# Patient Record
Sex: Female | Born: 1980 | Race: White | Hispanic: No | Marital: Single | Smoking: Current some day smoker
Health system: Southern US, Community
[De-identification: ages and names within clinical notes are randomized; demographics above are authoritative.]

## PROBLEM LIST (undated history)

## (undated) DIAGNOSIS — T4145XA Adverse effect of unspecified anesthetic, initial encounter: Secondary | ICD-10-CM

## (undated) DIAGNOSIS — F329 Major depressive disorder, single episode, unspecified: Secondary | ICD-10-CM

## (undated) DIAGNOSIS — G4730 Sleep apnea, unspecified: Secondary | ICD-10-CM

## (undated) DIAGNOSIS — K219 Gastro-esophageal reflux disease without esophagitis: Secondary | ICD-10-CM

## (undated) DIAGNOSIS — T8859XA Other complications of anesthesia, initial encounter: Secondary | ICD-10-CM

## (undated) DIAGNOSIS — F32A Depression, unspecified: Secondary | ICD-10-CM

## (undated) DIAGNOSIS — R05 Cough: Secondary | ICD-10-CM

## (undated) DIAGNOSIS — N62 Hypertrophy of breast: Secondary | ICD-10-CM

## (undated) DIAGNOSIS — F419 Anxiety disorder, unspecified: Secondary | ICD-10-CM

## (undated) DIAGNOSIS — R0981 Nasal congestion: Secondary | ICD-10-CM

## (undated) DIAGNOSIS — K5090 Crohn's disease, unspecified, without complications: Secondary | ICD-10-CM

## (undated) HISTORY — DX: Anxiety disorder, unspecified: F41.9

## (undated) HISTORY — DX: Depression, unspecified: F32.A

## (undated) HISTORY — PX: TONSILLECTOMY AND ADENOIDECTOMY: SHX28

## (undated) HISTORY — PX: COLONOSCOPY: SHX174

## (undated) HISTORY — DX: Major depressive disorder, single episode, unspecified: F32.9

## (undated) HISTORY — DX: Crohn's disease, unspecified, without complications: K50.90

---

## 2002-01-18 ENCOUNTER — Other Ambulatory Visit: Admission: RE | Admit: 2002-01-18 | Discharge: 2002-01-18 | Payer: Self-pay | Admitting: Obstetrics and Gynecology

## 2002-05-23 ENCOUNTER — Ambulatory Visit (HOSPITAL_COMMUNITY): Admission: RE | Admit: 2002-05-23 | Discharge: 2002-05-23 | Payer: Self-pay | Admitting: Pulmonary Disease

## 2002-06-03 ENCOUNTER — Ambulatory Visit (HOSPITAL_COMMUNITY): Admission: RE | Admit: 2002-06-03 | Discharge: 2002-06-03 | Payer: Self-pay | Admitting: Pulmonary Disease

## 2005-04-04 ENCOUNTER — Emergency Department (HOSPITAL_COMMUNITY): Admission: EM | Admit: 2005-04-04 | Discharge: 2005-04-04 | Payer: Self-pay | Admitting: *Deleted

## 2005-05-01 ENCOUNTER — Ambulatory Visit (HOSPITAL_COMMUNITY): Admission: RE | Admit: 2005-05-01 | Discharge: 2005-05-01 | Payer: Self-pay | Admitting: Pulmonary Disease

## 2005-05-08 ENCOUNTER — Ambulatory Visit (HOSPITAL_COMMUNITY): Admission: RE | Admit: 2005-05-08 | Discharge: 2005-05-08 | Payer: Self-pay | Admitting: Pulmonary Disease

## 2005-05-12 ENCOUNTER — Ambulatory Visit (HOSPITAL_COMMUNITY): Admission: RE | Admit: 2005-05-12 | Discharge: 2005-05-12 | Payer: Self-pay | Admitting: General Surgery

## 2005-05-12 ENCOUNTER — Encounter (INDEPENDENT_AMBULATORY_CARE_PROVIDER_SITE_OTHER): Payer: Self-pay | Admitting: General Surgery

## 2005-05-12 HISTORY — PX: LAPAROSCOPIC CHOLECYSTECTOMY: SUR755

## 2006-02-25 ENCOUNTER — Other Ambulatory Visit: Admission: RE | Admit: 2006-02-25 | Discharge: 2006-02-25 | Payer: Self-pay | Admitting: Obstetrics and Gynecology

## 2010-12-02 ENCOUNTER — Encounter: Payer: Self-pay | Admitting: Pulmonary Disease

## 2011-03-28 NOTE — Op Note (Signed)
Pamela [name], Pamela [name]          ACCOUNT NO.:  [id number]   MEDICAL RECORD NO.:  [id number]          PATIENT TYPE:  AMB   LOCATION:  DAY                           FACILITY:  [facility]   PHYSICIAN:  Dalia Heading, M.D.  DATE OF BIRTH:  01-Aug-1981   DATE OF PROCEDURE:  05/12/2005  DATE OF DISCHARGE:                                 OPERATIVE REPORT   AGE:  30 years old.   PREOPERATIVE DIAGNOSIS:  Chronic cholecystitis.   POSTOPERATIVE DIAGNOSIS:  Chronic cholecystitis.   PROCEDURE:  Laparoscopic cholecystectomy.   SURGEON:  Dalia Heading, M.D.   ANESTHESIA:  General endotracheal.   INDICATIONS:  The patient is a 30 year old white female who presents with  chronic cholecystitis. The risks and benefits of the procedure including  bleeding, infection, hepatobiliary injury, and possibility of an open  procedure were fully explained to the patient, gave informed consent.   PROCEDURE NOTE:  The patient is placed in the supine position. After  induction of general endotracheal anesthesia, the abdomen was prepped and  draped using the usual sterile technique with Betadine. Surgical site  confirmation was performed.   A supraumbilical incision was made down to the fascia. Veress needle was  introduced into the abdominal cavity and confirmation of placement was done  using the saline drop test. The abdomen was then insufflated to 16 mmHg  pressure. A 11-mm trocar was introduced into the abdominal cavity under  direct visualization without difficulty. The patient was placed in reversed  Trendelenburg position. An additional 11-mm trocar was placed epigastric  region and  5-mm trocars placed in the right upper quadrant and right flank  regions. Liver was inspected, noted to be within normal limits. The  gallbladder was retracted superior and laterally. The dissection was begun  around the infundibulum of the gallbladder. The cystic duct was first  identified. Its juncture to the  infundibulum fully identified. Endoclips  were placed proximally and distally on the cystic duct. The cystic duct was  divided. This was likewise done with the cystic artery. The gallbladder was  then freed away from the gallbladder fossa using Bovie electrocautery. The  gallbladder was delivered through the epigastric trocar site using EndoCatch  bag. The gallbladder fossa was inspected and no abnormal bleeding or bile  leakage was noted. Surgicel was placed in the gallbladder fossa. All fluid  then evacuate from the abdominal cavity prior to removal the trocars.   All wounds were irrigated with normal saline. All wounds were injected with  0.5% Sensorcaine. The supraumbilical fascia was reapproximated using an 0  Vicryl interrupted suture. All skin incisions were closed using staples.  Betadine ointment and dry sterile dressings were applied.   All tape and needle counts were correct at the end of the procedure. The  patient was extubated in the operating room and went back to recovery room  awake in stable condition.   COMPLICATIONS:  None.   SPECIMEN:  Gallbladder.   BLOOD LOSS:  Minimal.       MAJ/MEDQ  D:  05/12/2005  T:  05/12/2005  Job:  [id number]   cc:   Ramon Dredge  Emilie Rutter, M.D.  [place]  [place] [address]  Fax: [phone]

## 2014-02-03 ENCOUNTER — Other Ambulatory Visit: Payer: Self-pay | Admitting: Gastroenterology

## 2014-02-03 DIAGNOSIS — R109 Unspecified abdominal pain: Secondary | ICD-10-CM

## 2014-02-03 DIAGNOSIS — R197 Diarrhea, unspecified: Secondary | ICD-10-CM

## 2014-02-03 DIAGNOSIS — K5000 Crohn's disease of small intestine without complications: Secondary | ICD-10-CM

## 2014-02-17 ENCOUNTER — Other Ambulatory Visit: Payer: Self-pay | Admitting: Gastroenterology

## 2014-02-17 DIAGNOSIS — K5000 Crohn's disease of small intestine without complications: Secondary | ICD-10-CM

## 2014-02-24 ENCOUNTER — Ambulatory Visit
Admission: RE | Admit: 2014-02-24 | Discharge: 2014-02-24 | Disposition: A | Discharge status: Home / Self Care | Payer: BC Managed Care – PPO | Source: Ambulatory Visit | Attending: Gastroenterology | Admitting: Gastroenterology

## 2014-02-24 DIAGNOSIS — K5000 Crohn's disease of small intestine without complications: Secondary | ICD-10-CM

## 2014-02-24 MED ORDER — IOHEXOL 300 MG/ML  SOLN
125.0000 mL | Freq: Once | INTRAMUSCULAR | Status: AC | PRN
  Administered 2014-02-24: 125 mL via INTRAVENOUS

## 2014-03-14 ENCOUNTER — Encounter (HOSPITAL_COMMUNITY): Payer: BC Managed Care – PPO | Admitting: Psychiatry

## 2014-03-14 ENCOUNTER — Encounter (INDEPENDENT_AMBULATORY_CARE_PROVIDER_SITE_OTHER): Payer: Self-pay

## 2014-03-14 NOTE — Progress Notes (Signed)
This encounter was created in error - please disregard.

## 2014-03-15 ENCOUNTER — Telehealth (HOSPITAL_COMMUNITY): Payer: Self-pay

## 2014-03-15 NOTE — Telephone Encounter (Signed)
03/15/14 9:40am Patient came on 03/14/14 for an initial visit with Dr. Michae KavaAgarwal but patient stated that she already had a psychiatrist  And requested a refund on her co-pay - patient called this morning stating that an error was made that co-pay was charged twice - processed a manual refund for $70.00 - will mail the pt her copy of the receipt./sh

## 2014-05-06 ENCOUNTER — Telehealth: Payer: Self-pay | Admitting: Internal Medicine

## 2014-05-06 NOTE — Telephone Encounter (Signed)
Mann pt,  Small bowel Crohn's disease - treated with AZA. Diagnosed 2009 Colon Jan 2015 - polyps, diverticulosis, and Crohn's disease unclear large or small bowel CT-enterography April 2015 no active Crohn's Ate popcorn and then blood in stool last night, today, diarrhea with blood. Blood is bright red, in water and on TP.  Only with BM.  No clots Lower abd pain just before BM, then relieved.  No other abdominal pain in between cramping associated with bowel movement No fevers or chills No change in appetite  This does not sound like an active Crohn's flare. Possible outlet bleeding I will notify Dr. Loreta Ave and I advised the patient that should she develop fever chills, abdominal pain, ongoing bleeding, or any other concerning symptom to go to the ER. She voiced understanding Patient report she may try a single dose of cholestyramine previously prescribed by Dr. Loreta Ave to help control diarrhea

## 2014-05-10 ENCOUNTER — Ambulatory Visit (INDEPENDENT_AMBULATORY_CARE_PROVIDER_SITE_OTHER): Payer: BC Managed Care – PPO | Admitting: General Surgery

## 2014-05-10 ENCOUNTER — Encounter (INDEPENDENT_AMBULATORY_CARE_PROVIDER_SITE_OTHER): Payer: Self-pay | Admitting: General Surgery

## 2014-05-10 DIAGNOSIS — K625 Hemorrhage of anus and rectum: Secondary | ICD-10-CM | POA: Insufficient documentation

## 2014-05-10 NOTE — Patient Instructions (Signed)
Can follow up with Dr. Loreta Ave

## 2014-05-22 NOTE — Progress Notes (Signed)
Patient ID: Pamela [name], female   DOB: 02/06/1981, 33 y.o.   MRN: [id number]  Chief Complaint  Patient presents with  . Hemorrhoids    HPI Pamela Manilalizabeth R Tocci is a 33 y.o. female.  We're asked to see the patient in consultation by Dr. Loreta AveMann to evaluate her for rectal bleeding. The patient is a 33 year old white female who has a history of Crohn's disease. On Friday she ate some popcorn. On Saturday she developed significant bleeding with her bowel movements. She normally has liquid bowel movements. She went to see her gastroenterologist who referred her here for further evaluation. She denies any rectal pain. She has had no bleeding over the last 24 hours.  HPI  Past Medical History  Diagnosis Date  . Crohn's disease   . Hemorrhoids, internal   . History of anal fissures     History reviewed. No pertinent past surgical history.  No family history on file.  Social History History  Substance Use Topics  . Smoking status: Not on file  . Smokeless tobacco: Not on file  . Alcohol Use: Not on file    Not on File  Current Outpatient Prescriptions  Medication Sig Dispense Refill  . azaTHIOprine (IMURAN) 50 MG tablet Take 50 mg by mouth daily.      . BuPROPion HCl (WELLBUTRIN PO) Take 350 mg by mouth.      . busPIRone (BUSPAR) 15 MG tablet Take 15 mg by mouth 2 (two) times daily.      Marland Kitchen. FLUoxetine (PROZAC) 40 MG capsule Take 40 mg by mouth daily.       No current facility-administered medications for this visit.    Review of Systems Review of Systems  Constitutional: Negative.   HENT: Negative.   Eyes: Negative.   Respiratory: Negative.   Cardiovascular: Negative.   Gastrointestinal: Positive for diarrhea and anal bleeding.  Endocrine: Negative.   Genitourinary: Negative.   Musculoskeletal: Negative.   Skin: Negative.   Allergic/Immunologic: Negative.   Neurological: Negative.   Hematological: Negative.   Psychiatric/Behavioral: Negative.     There were no  vitals taken for this visit.  Physical Exam Physical Exam  Constitutional: She is oriented to person, place, and time. She appears well-developed and well-nourished.  HENT:  Head: Normocephalic and atraumatic.  Eyes: Conjunctivae and EOM are normal. Pupils are equal, round, and reactive to light.  Neck: Normal range of motion. Neck supple.  Cardiovascular: Normal rate, regular rhythm and normal heart sounds.   Pulmonary/Chest: Effort normal and breath sounds normal.  Abdominal: Soft. Bowel sounds are normal.  Genitourinary:  On exam her perirectal skin looks normal. On digital exam she has no palpable mass and normal rectal tone. On anoscopic exam I see no evidence of significant irritated internal hemorrhoidal disease and there is no blood in the rectal vault.  Musculoskeletal: Normal range of motion.  Lymphadenopathy:    She has no cervical adenopathy.  Neurological: She is alert and oriented to person, place, and time.  Skin: Skin is warm and dry.  Psychiatric: She has a normal mood and affect. Her behavior is normal.    Data Reviewed As above  Assessment    The patient has a history of Crohn's disease and presented initially with some rectal bleeding. This has since resolved. I see no evidence of significant hemorrhoidal tissue that could be the source of the bleeding     Plan    At this point I would recommend that she return to her gastroenterologist  for further evaluation and possible colonoscopy or flexible sigmoidoscopy        TOTH III,Geneen Dieter S 05/22/2014, 10:54 AM

## 2014-08-08 ENCOUNTER — Encounter: Payer: Self-pay | Admitting: Neurology

## 2014-08-08 ENCOUNTER — Ambulatory Visit (INDEPENDENT_AMBULATORY_CARE_PROVIDER_SITE_OTHER): Payer: BC Managed Care – PPO | Admitting: Neurology

## 2014-08-08 VITALS — BP 128/89 | HR 113 | Temp 98.6°F | Ht 68.0 in | Wt 298.0 lb

## 2014-08-08 DIAGNOSIS — G479 Sleep disorder, unspecified: Secondary | ICD-10-CM

## 2014-08-08 DIAGNOSIS — G4700 Insomnia, unspecified: Secondary | ICD-10-CM

## 2014-08-08 DIAGNOSIS — R351 Nocturia: Secondary | ICD-10-CM

## 2014-08-08 DIAGNOSIS — G4733 Obstructive sleep apnea (adult) (pediatric): Secondary | ICD-10-CM

## 2014-08-08 DIAGNOSIS — G478 Other sleep disorders: Secondary | ICD-10-CM

## 2014-08-08 DIAGNOSIS — G2581 Restless legs syndrome: Secondary | ICD-10-CM

## 2014-08-08 DIAGNOSIS — G4730 Sleep apnea, unspecified: Secondary | ICD-10-CM

## 2014-08-08 NOTE — Patient Instructions (Signed)

## 2014-08-08 NOTE — Progress Notes (Signed)
Subjective:    Patient ID: Galen Manilalizabeth R Silliman is a 33 y.o. female.  HPI    Huston FoleySaima Rayce Brahmbhatt, MD, PhD [facility] [address], [address] [address] [place], [place] [id number]  Dear Jolee EwingJyothi,   I saw your patient, Broadus Johnlizabeth Aja, upon your kind request in my neurologic clinic today for initial consultation of her sleep disorder, in particular, concern for underlying obstructive sleep apnea. The patient is unaccompanied today. As you know, Ms. Orvan FalconerCampbell is a very friendly 33 year old right-handed woman with an underlying medical history of Crohn's disease, hemorrhoids, OCD, colonic polyps, diverticulosis, depression, anxiety, acid reflux disease, gluten enteropathy and obesity, who was noted to have witnessed apneas during a recent colonoscopy procedure. She has a longstanding history of insomnia since childhood and has been on numerous medications for this, including Ambien and Lunesta (did not help), Trazodone and Seroquel (helped, but was not good for her GI illness), Valium did not help and caused HAs. OTC sleep aids did not help, except when she took about 5 benadryl. Klonopin helps, but rarely takes it and is discouraged from taking it daily. Most recently, she has been placed on Zyrexa in 2012. She has gained a lot of weight on it. She follows with Oneta Rackobyn Bridges, [facility].  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 She has noted RLS symptoms, especially when the Zyprexa kicks in. She has no FHx of OSA or insomnia or RLS.    Her typical bedtime is reported to be around 10 PM and usual wake time is around 7:45 to as late as 11 AM. Sleep onset typically occurs within 15 minutes. She reports feeling marginally rested upon awakening. She has daytime drowsy first thing in the mornings. She feels tired, but not sleepy. She wakes up on an average 4 times in the middle of the night and has to go to the bathroom 2 times on a typical night. She denies  morning headaches.  She reports excessive daytime somnolence (EDS) and Her Epworth Sleepiness Score (ESS) is 3/24 today. She has not fallen asleep while driving. The patient has not been taking a planned nap.  She has been known to snore for the past few years. Snoring is reportedly moderate, and associated with choking sounds and witnessed apneas. The patient denies a sense of choking or strangling feeling. There is report of nighttime reflux, with no nighttime cough experienced. She denies cataplexy, sleep paralysis, hypnagogic or hypnopompic hallucinations, or sleep attacks. She does report vivid dreams, no nightmares, dream enactments, or parasomnias, such as sleep talking or sleep walking. The patient has not had a sleep study or a home sleep test.  She consumes 0 caffeinated beverages per day, stopped smoking in June 2015 and and rarely drinks alcohol. There is a TV in her bedroom and she watches it at night turns out for falling asleep.  Her Past Medical History Is Significant For: Past Medical History  Diagnosis Date  . Crohn's disease   . Hemorrhoids, internal   . History of anal fissures   . Depression   . Anxiety     Her Past Surgical History Is Significant For: Past Surgical History  Procedure Laterality Date  . Gallbladder surgery  2006    Her Family History Is Significant For: Family History  Problem Relation Age of Onset  . Crohn's disease Maternal Grandmother   . Cancer Paternal Grandfather     stomach    Her Social History Is Significant  For: History   Social History  . Marital Status: Single    Spouse Name: N/A    Number of Children: 0  . Years of Education: BS   Occupational History  .      [employer]   Social History Main Topics  . Smoking status: Former Games developer  . Smokeless tobacco: Never Used     Comment: quit 04/2014  . Alcohol Use: Yes     Comment: one time monthly  . Drug Use: No  . Sexual Activity: None   Other Topics Concern  . None   Social  History Narrative   Patient is right handed and resides with roommate, consumes no caffeine.    Her Allergies Are:  No Known Allergies:   Her Current Medications Are:  Outpatient Encounter Prescriptions as of 08/08/2014  Medication Sig  . azaTHIOprine (IMURAN) 50 MG tablet Take 50 mg by mouth daily.  . BuPROPion HCl (WELLBUTRIN PO) Take 350 mg by mouth.  . busPIRone (BUSPAR) 15 MG tablet Take 15 mg by mouth 2 (two) times daily.  Marland Kitchen FLUoxetine (PROZAC) 40 MG capsule Take 40 mg by mouth daily.  Marland Kitchen lurasidone (LATUDA) 40 MG TABS tablet Take 40 mg by mouth daily with breakfast.  . OLANZapine (ZYPREXA) 15 MG tablet Take 15 mg by mouth at bedtime.  :  Review of Systems:  Out of a complete 14 point review of systems, all are reviewed and negative with the exception of these symptoms as listed below:  Review of Systems  Constitutional:       Weight gain,fatigue  Gastrointestinal:       Diarrhea  Neurological:       Insomnia,snoring, restless legs  Psychiatric/Behavioral:       Anxiety, not enough sleep, decreased energy, change in appetite    Objective:  Neurologic Exam  Physical Exam Physical Examination:   Filed Vitals:   08/08/14 0938  BP: 128/89  Pulse: 113  Temp: 98.6 F (37 C)    General Examination: The patient is a very pleasant 33 y.o. female in no acute distress. She appears well-developed and well-nourished and well groomed. She is obese.   HEENT: Normocephalic, atraumatic, pupils are equal, round and reactive to light and accommodation. Funduscopic exam is normal with sharp disc margins noted. Extraocular tracking is good without limitation to gaze excursion or nystagmus noted. Normal smooth pursuit is noted. Hearing is grossly intact. Tympanic membranes are clear bilaterally. Face is symmetric with normal facial animation and normal facial sensation. Speech is clear with no dysarthria noted. There is no hypophonia. There is no lip, neck/head, jaw or voice tremor.  Neck is supple with full range of passive and active motion. There are no carotid bruits on auscultation. Oropharynx exam reveals: mouth dryness, good dental hygiene and severe airway crowding, due to thicker soft palate and swollen uvula, larger tonsils and enlarged tongue. Mallampati is class II. Tongue protrudes centrally and palate elevates symmetrically. Tonsils are 2-3+ in size. Neck size is 17.5 inches. She has a nearly Absent overbite. Nasal inspection reveals no significant nasal mucosal bogginess or redness and no septal deviation.   Chest: Clear to auscultation without wheezing, rhonchi or crackles noted.  Heart: S1+S2+0, regular and normal without murmurs, rubs or gallops noted.   Abdomen: Soft, non-tender and non-distended with normal bowel sounds appreciated on auscultation.  Extremities: There is no pitting edema in the distal lower extremities bilaterally. Pedal pulses are intact.  Skin: Warm and dry without trophic changes noted. There are  no varicose veins.  Musculoskeletal: exam reveals no obvious joint deformities, tenderness or joint swelling or erythema.   Neurologically:  Mental status: The patient is awake, alert and oriented in all 4 spheres. Her immediate and remote memory, attention, language skills and fund of knowledge are appropriate. There is no evidence of aphasia, agnosia, apraxia or anomia. Speech is clear with normal prosody and enunciation. Thought process is linear. Mood is normal and affect is normal.  Cranial nerves II - XII are as described above under HEENT exam. In addition: shoulder shrug is normal with equal shoulder height noted. Motor exam: Normal bulk, strength and tone is noted. There is no drift, tremor or rebound. Romberg is negative. Reflexes are 2+ throughout. Babinski: Toes are flexor bilaterally. Fine motor skills and coordination: intact with normal finger taps, normal hand movements, normal rapid alternating patting, normal foot taps and normal  foot agility.  Cerebellar testing: No dysmetria or intention tremor on finger to nose testing. Heel to shin is unremarkable bilaterally. There is no truncal or gait ataxia.  Sensory exam: intact to light touch, pinprick, vibration, temperature sense in the upper and lower extremities.  Gait, station and balance: She stands easily. No veering to one side is noted. No leaning to one side is noted. Posture is age-appropriate and stance is narrow based. Gait shows normal stride length and normal pace. No problems turning are noted. She turns en bloc. Tandem walk is unremarkable. Intact toe and heel stance is noted.               Assessment and Plan:   In summary, DARONDA GAUR is a very pleasant 33 y.o.-year old female  with an underlying medical history of Crohn's disease, hemorrhoids, OCD, colonic polyps, diverticulosis, depression, anxiety, acid reflux disease, gluten enteropathy and obesity, with a history and physical exam highly concerning for obstructive sleep apnea (OSA). She reports sleep disruption, nocturia, nonrestorative sleep, witnessed apneas, and she also reports some restless leg symptoms. I had a long chat with the patient about my findings and the diagnosis of OSA, its prognosis and treatment options. We talked about medical treatments, surgical interventions and non-pharmacological approaches. I explained in particular the risks and ramifications of untreated moderate to severe OSA, especially with respect to developing cardiovascular disease down the Road, including congestive heart failure, difficult to treat hypertension, cardiac arrhythmias, or stroke. Even type 2 diabetes has, in part, been linked to untreated OSA. Symptoms of untreated OSA include daytime sleepiness, memory problems, mood irritability and mood disorder such as depression and anxiety, lack of energy, as well as recurrent headaches, especially morning headaches. We talked about trying to maintain a healthy  lifestyle in general, as well as the importance of weight control. I encouraged the patient to eat healthy, exercise daily and keep well hydrated, to keep a scheduled bedtime and wake time routine, to not skip any meals and eat healthy snacks in between meals. I advised the patient not to drive when feeling sleepy. I recommended the following at this time: sleep study with potential positive airway pressure titration. (We will score hypopneas at 3*% and split the sleep study into diagnostic and treatment portion, if the estimated. 2 hour AHI is >15/h).   I explained the sleep test procedure to the patient and also outlined possible surgical and non-surgical treatment options of OSA, including the use of a custom-made dental device (which would require a referral to a specialist dentist or oral surgeon), upper airway surgical options, such as  pillar implants, radiofrequency surgery, tongue base surgery, and UPPP (which would involve a referral to an ENT surgeon). Rarely, jaw surgery such as mandibular advancement may be considered.  I also explained the CPAP treatment option to the patient, who indicated that she would be willing to try CPAP if the need arises. I explained the importance of being compliant with PAP treatment, not only for insurance purposes but primarily to improve Her symptoms, and for the patient's long term health benefit, including to reduce Her cardiovascular risks. I answered all her questions today and the patient was in agreement. I would like to see her back after the sleep study is completed and encouraged her to call with any interim questions, concerns, problems or updates.   Thank you very much for allowing me to participate in the care of this nice patient. If I can be of any further assistance to you please do not hesitate to call me at [phone].  Sincerely,   Huston Foley, MD, PhD

## 2014-08-09 ENCOUNTER — Ambulatory Visit (INDEPENDENT_AMBULATORY_CARE_PROVIDER_SITE_OTHER): Payer: BC Managed Care – PPO | Admitting: Neurology

## 2014-08-09 DIAGNOSIS — G478 Other sleep disorders: Secondary | ICD-10-CM

## 2014-08-09 DIAGNOSIS — R351 Nocturia: Secondary | ICD-10-CM

## 2014-08-09 DIAGNOSIS — G4733 Obstructive sleep apnea (adult) (pediatric): Secondary | ICD-10-CM

## 2014-08-09 DIAGNOSIS — G2581 Restless legs syndrome: Secondary | ICD-10-CM

## 2014-08-11 ENCOUNTER — Telehealth: Payer: Self-pay | Admitting: Neurology

## 2014-08-11 DIAGNOSIS — G4733 Obstructive sleep apnea (adult) (pediatric): Secondary | ICD-10-CM

## 2014-08-11 NOTE — Telephone Encounter (Signed)
Please call and notify patient that the recent sleep study confirmed the diagnosis of OSA. She did well with CPAP during the study with significant improvement of the respiratory events. Therefore, I would like start the patient on CPAP at home. I placed the order in the chart.   Arrange for CPAP set up at home through a DME company of patient's choice and fax/route report to PCP and referring MD (if other than PCP).   The patient will also need a follow up appointment with me in 6-8 weeks post set up that has to be scheduled; help the patient schedule this (in a follow-up slot).   Please re-enforce the importance of compliance with treatment and the need for us to monitor compliance data.   Once you have spoken to the patient and scheduled the return appointment, you may close this encounter, thanks,   Janeene Sand, MD, PhD [facility] ([facility]    

## 2014-08-15 ENCOUNTER — Encounter: Payer: Self-pay | Admitting: Neurology

## 2014-08-16 NOTE — Telephone Encounter (Signed)
Patient was contacted and provided the results of her split night study.  Patient was informed that given the severity of her sleep apnea the doctor had recommended CPAP therapy.  Patient was referred to [facility] for CPAP set up.  Patient informed that a copy would be mailed to her and I faxed a copy to Dr. Charna Najai.  Patient instructed to contact office to schedule a follow up appointment 6-8 weeks after beginning therapy.

## 2014-09-21 ENCOUNTER — Encounter: Payer: Self-pay | Admitting: Neurology

## 2014-11-22 ENCOUNTER — Encounter: Payer: Self-pay | Admitting: Neurology

## 2014-12-07 ENCOUNTER — Encounter: Payer: Self-pay | Admitting: Neurology

## 2014-12-11 ENCOUNTER — Encounter: Payer: Self-pay | Admitting: Neurology

## 2014-12-11 ENCOUNTER — Ambulatory Visit (INDEPENDENT_AMBULATORY_CARE_PROVIDER_SITE_OTHER): Payer: BC Managed Care – PPO | Admitting: Neurology

## 2014-12-11 VITALS — BP 111/68 | HR 91 | Temp 98.4°F | Ht 68.0 in | Wt 273.0 lb

## 2014-12-11 DIAGNOSIS — G4733 Obstructive sleep apnea (adult) (pediatric): Secondary | ICD-10-CM

## 2014-12-11 DIAGNOSIS — Z9989 Dependence on other enabling machines and devices: Principal | ICD-10-CM

## 2014-12-11 NOTE — Progress Notes (Signed)
Subjective:    Pamela [name] ID: Pamela Pamela [name] is a 34 y.o. female.  HPI     Interim history:   Pamela Pamela [name] is a 34 year old right-handed woman with an underlying medical history of Crohn's disease, hemorrhoids, OCD, colonic polyps, diverticulosis, depression, anxiety, acid reflux disease, gluten enteropathy and obesity, who presents for follow-up consultation of Pamela Pamela [name] obstructive sleep apnea, now on CPAP therapy. Pamela Pamela [name] is unaccompanied today. I first met Pamela Pamela [name] on 08/08/2014 at which time she was referred by Pamela Pamela [name] GI physician, due to witnessed apneas noted during a recent colonoscopy procedure. I requested that she return for sleep study. She had a split-night sleep study on 08/09/2014 and underwent over Pamela Pamela [name] sleep test results with Pamela Pamela [name] in detail today. Baseline sleep efficiency was 73.2% with a latency to sleep of 23 minutes and wake after sleep onset of 18.5 minutes with mild sleep fragmentation noted. She had a markedly elevated arousal index due to respiratory events. She had absence of REM sleep during Pamela baseline part of Pamela study. She had moderate to loud snoring. Total AHI was 167 per hour. Baseline oxygen saturation was 92%, nadir was 84%. She was titrated on CPAP. Sleep efficiency was 79.6%. She had an increased percentage of slow-wave sleep and a markedly increased percentage of REM sleep. Average oxygen saturation was 94%, nadir was 89%. No significant PLMS were noted during either part of Pamela study. She was titrated from 5-12 cm with an AHI of 6.8 per hour at Pamela final pressure. I reviewed Pamela Pamela [name] compliance data from 08/22/2014 through 09/20/2014 which is a total of 30 days during which time she used Pamela Pamela [name] machine 28 days with percent used days greater than 4 hours at 77%, indicating adequate compliance with an average usage of 7 hours and 46 minutes, pressure at 12 cm with EPR of 3, residual AHI low at 1.5 per hour, and leak low at 9.6 L/m at Pamela 95th percentile. I reviewed Pamela Pamela [name] compliance  data from 10/23/2014 through 11/21/2014 which is a total of 30 days during which time she used Pamela Pamela [name] machine 29 days with percent used days greater than 4 hours at 87%, indicating very good compliance, average usage of 7 hours and 23 minutes, residual AHI at 0.6 per hour and leak low.  Today, I reviewed Pamela Pamela [name] compliance data from 11/07/2014 through 12/06/2014 which is a total of 30 days during which time she used Pamela Pamela [name] machine 26 days with percent used days greater than 4 hours at 77%, average usage of 6 hours and 30 minutes, pressure at 12 cm with EPR of 3, residual AHI of 0.8 per hour and leak low at 5.6 L/m at Pamela 95th percentile.  Today, she reports doing better with Pamela Pamela [name] sleep. She is less restless and sleeps better, feels better rested. She has had more stress at work, however, and will request a sooner appointment with Pamela Pamela [name] psychiatrist.  She has been taking an OTC sleep aid. She is off of Zyprexa, and has been able to lose weight. She is no longer on Imuran as it was not helpful.  She has a longstanding history of insomnia since childhood and has been on numerous medications for this, including Ambien and Lunesta (did not help), Trazodone and Seroquel (helped, but was not good for Pamela Pamela [name] GI illness), Valium did not help and caused HAs. OTC sleep aids did not help, except when she took about 5 benadryl. Klonopin helps, but rarely takes it and is discouraged from taking it daily. Most recently, she has been placed  on Zyprexa in 2012. She has gained a lot of weight on it. She follows with [name], NP at [facility].   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 She has noted RLS symptoms, especially when Pamela Zyprexa kicks in. She has no FHx of OSA or insomnia or RLS.    Pamela Pamela [name] typical bedtime is reported to be around 10 PM and usual wake time is around 7:45 to as late as 11 AM. Sleep onset typically occurs within 15 minutes. She reports feeling marginally rested upon awakening. She has  daytime drowsy first thing in Pamela mornings. She feels tired, but not sleepy. She wakes up on an average 4 times in Pamela middle of Pamela night and has to go to Pamela bathroom 2 times on a typical night. She denies morning headaches.   She reports excessive daytime somnolence (EDS) and Pamela Pamela [name] Epworth Sleepiness Score (ESS) is 3/24 today. She has not fallen asleep while driving. Pamela Pamela [name] has not been taking a planned nap.   She has been known to snore for Pamela past few years. Snoring is reportedly moderate, and associated with choking sounds and witnessed apneas. Pamela Pamela [name] denies a sense of choking or strangling feeling. There is report of nighttime reflux, with no nighttime cough experienced. She denies cataplexy, sleep paralysis, hypnagogic or hypnopompic hallucinations, or sleep attacks. She does report vivid dreams, no nightmares, dream enactments, or parasomnias, such as sleep talking or sleep walking. Pamela Pamela [name] has not had a sleep study or a home sleep test.   She consumes 0 caffeinated beverages per day, stopped smoking in June 2015 and and rarely drinks alcohol. There is a TV in Pamela Pamela [name] bedroom and she watches it at night turns out for falling asleep.   Pamela Pamela [name] Past Medical History Is Significant For: Past Medical History  Diagnosis Date  . Crohn's disease   . Hemorrhoids, internal   . History of anal fissures   . Depression   . Anxiety     Pamela Pamela [name] Past Surgical History Is Significant For: Past Surgical History  Procedure Laterality Date  . Gallbladder surgery  2006    Pamela Pamela [name] Family History Is Significant For: Family History  Problem Relation Age of Onset  . Crohn's disease Maternal Grandmother   . Cancer Paternal Grandfather     stomach    Pamela Pamela [name] Social History Is Significant For: History   Social History  . Marital Status: Single    Spouse Name: N/A    Number of Children: 0  . Years of Education: BS   Occupational History  .      [employer]   Social History Main Topics  . Smoking status:  Current Some Day Smoker  . Smokeless tobacco: Never Used     Comment: quit 04/2014,started back smoking 11/2014  . Alcohol Use: 0.0 oz/week    0 Not specified per week     Comment: one time monthly  . Drug Use: No  . Sexual Activity: None   Other Topics Concern  . None   Social History Narrative   Pamela [name] is right handed and resides with roommate, consumes no caffeine.    Pamela Pamela [name] Allergies Are:  No Known Allergies:   Pamela Pamela [name] Current Medications Are:  Outpatient Encounter Prescriptions as of 12/11/2014  Medication Sig  . buPROPion (WELLBUTRIN XL) 300 MG 24 hr tablet Take by mouth daily.   . busPIRone (BUSPAR) 15 MG tablet Take 15 mg by mouth 3 (three) times daily.   Marland Kitchen FLUoxetine (PROZAC)  40 MG capsule Take 40 mg by mouth daily.  Marland Kitchen lurasidone (LATUDA) 40 MG TABS tablet Take 40 mg by mouth daily with breakfast.  . Lurasidone HCl 20 MG TABS Take by mouth daily. Daily with breakfast  . [DISCONTINUED] azaTHIOprine (IMURAN) 50 MG tablet Take 50 mg by mouth daily.  . [DISCONTINUED] BuPROPion HCl (WELLBUTRIN PO) Take 350 mg by mouth.  . [DISCONTINUED] OLANZapine (ZYPREXA) 15 MG tablet Take 15 mg by mouth at bedtime.  :  Review of Systems:  Out of a complete 14 point review of systems, all are reviewed and negative with Pamela exception of these symptoms as listed below:   Review of Systems  Psychiatric/Behavioral:       Anxious    Objective:  Neurologic Exam  Physical Exam Physical Examination:   Filed Vitals:   12/11/14 0845  BP: 111/68  Pulse: 91  Temp: 98.4 F (36.9 C)    General Examination: Pamela Pamela [name] is a very pleasant 34 y.o. female in no acute distress. She appears well-developed and well-nourished and well groomed. She is obese.   HEENT: Normocephalic, atraumatic, pupils are equal, round and reactive to light and accommodation. Funduscopic exam is normal with sharp disc margins noted. Extraocular tracking is good without limitation to gaze excursion or nystagmus noted.  Normal smooth pursuit is noted. Hearing is grossly intact. Tympanic membranes are clear bilaterally. Face is symmetric with normal facial animation and normal facial sensation. Speech is clear with no dysarthria noted. There is no hypophonia. There is no lip, neck/head, jaw or voice tremor. Neck is supple with full range of passive and active motion. There are no carotid bruits on auscultation. Oropharynx exam reveals: mouth dryness, good dental hygiene and severe airway crowding, due to thicker soft palate and swollen uvula, larger tonsils and enlarged tongue. Mallampati is class II. Tongue protrudes centrally and palate elevates symmetrically. Tonsils are 2-3+ in size. Throat is slightly erythematous.  Chest: Clear to auscultation without wheezing, rhonchi or crackles noted.  Heart: S1+S2+0, regular and normal without murmurs, rubs or gallops noted.   Abdomen: Soft, non-tender and non-distended with normal bowel sounds appreciated on auscultation.  Extremities: There is no pitting edema in Pamela distal lower extremities bilaterally. Pedal pulses are intact.  Skin: Warm and dry without trophic changes noted. There are no varicose veins.  Musculoskeletal: exam reveals no obvious joint deformities, tenderness or joint swelling or erythema.   Neurologically:  Mental status: Pamela Pamela [name] is awake, alert and oriented in all 4 spheres. Pamela Pamela [name] immediate and remote memory, attention, language skills and fund of knowledge are appropriate. There is no evidence of aphasia, agnosia, apraxia or anomia. Speech is clear with normal prosody and enunciation. Thought process is linear. Mood is normal and affect is normal.  Cranial nerves II - XII are as described above under HEENT exam. In addition: shoulder shrug is normal with equal shoulder height noted. Motor exam: Normal bulk, strength and tone is noted. There is no drift, tremor or rebound. Romberg is negative. Reflexes are 2+ throughout. Babinski: Toes are flexor  bilaterally. Fine motor skills and coordination: intact with normal finger taps, normal hand movements, normal rapid alternating patting, normal foot taps and normal foot agility.  Cerebellar testing: No dysmetria or intention tremor on finger to nose testing. Heel to shin is unremarkable bilaterally. There is no truncal or gait ataxia.  Sensory exam: intact to light touch, pinprick, vibration, temperature sense in Pamela upper and lower extremities.  Gait, station and balance: She stands easily.  No veering to one side is noted. No leaning to one side is noted. Posture is age-appropriate and stance is narrow based. Gait shows normal stride length and normal pace. No problems turning are noted. She turns en bloc. Tandem walk is unremarkable.              Assessment and Plan:   In summary, RAINBOW SALMAN is a very pleasant 34 year old female with an underlying medical history of Crohn's disease, hemorrhoids, OCD, colonic polyps, diverticulosis, depression, anxiety, acid reflux disease, gluten enteropathy and obesity, who presents for follow-up consultation of Pamela Pamela [name] severe obstructive sleep apnea, treated with CPAP at a pressure of 12 cm with good compliance. She is advised not to skip any days. We talked at length about Pamela Pamela [name] sleep test results from September 2015 and Pamela Pamela [name] compliance data. I explained all Pamela Pamela [name] numbers to Pamela Pamela [name] in detail. She understands Pamela diagnosis of severe obstructive sleep apnea and its implications long-term as far as cardiovascular complications. She has benefited from treatment. Thankfully she feels better in Pamela Pamela [name] sleep and during Pamela day. She does take an over-Pamela-counter sleep aid. She is no longer on Zyprexa or Imuran at this time. Pamela Pamela [name] sleep quality has improved. She is pleased with Pamela results.  I had a long chat with Pamela Pamela [name] about my findings and Pamela diagnosis of OSA, its prognosis and treatment options. We talked about medical treatments, surgical interventions and  non-pharmacological approaches. I explained in particular Pamela risks and ramifications of untreated moderate to severe OSA, especially with respect to developing cardiovascular disease down Pamela Road, including congestive heart failure, difficult to treat hypertension, cardiac arrhythmias, or stroke. Even type 2 diabetes has, in part, been linked to untreated OSA. Symptoms of untreated OSA include daytime sleepiness, memory problems, mood irritability and mood disorder such as depression and anxiety, lack of energy, as well as recurrent headaches, especially morning headaches. We talked about trying to maintain a healthy lifestyle in general, as well as Pamela importance of weight control. I encouraged Pamela Pamela [name] to eat healthy, exercise daily and keep well hydrated, to keep a scheduled bedtime and wake time routine, to not skip any meals and eat healthy snacks in between meals. I advised Pamela Pamela [name] not to drive when feeling sleepy. I recommended Pamela following at this time: continue using CPAP regularly and to not skip any nights.  I explained Pamela importance of being compliant with PAP treatment, not only for insurance purposes but primarily to improve Pamela Pamela [name] symptoms, and for Pamela Pamela [name]'s long term health benefit, including to reduce Pamela Pamela [name] cardiovascular risks. I answered all Pamela Pamela [name] questions today and Pamela Pamela [name] was in agreement. I would like to see Pamela Pamela [name] back in 6 months, sooner if Pamela need arises. I answered all Pamela Pamela [name] questions today and she was in agreement.

## 2014-12-11 NOTE — Patient Instructions (Signed)

## 2015-06-11 ENCOUNTER — Encounter: Payer: Self-pay | Admitting: Neurology

## 2015-06-11 ENCOUNTER — Ambulatory Visit (INDEPENDENT_AMBULATORY_CARE_PROVIDER_SITE_OTHER): Payer: BC Managed Care – PPO | Admitting: Neurology

## 2015-06-11 VITALS — BP 116/88 | HR 90 | Resp 16 | Ht 68.0 in | Wt 204.0 lb

## 2015-06-11 DIAGNOSIS — G4733 Obstructive sleep apnea (adult) (pediatric): Secondary | ICD-10-CM

## 2015-06-11 DIAGNOSIS — R634 Abnormal weight loss: Secondary | ICD-10-CM

## 2015-06-11 DIAGNOSIS — R519 Headache, unspecified: Secondary | ICD-10-CM

## 2015-06-11 DIAGNOSIS — R51 Headache: Secondary | ICD-10-CM

## 2015-06-11 NOTE — Progress Notes (Signed)
Subjective:    [name] is a 34 y.o. female.  HPI     Interim history:   Pamela [name] is a 34 year old right-handed woman with an underlying medical history of Crohn's disease, hemorrhoids, OCD, colonic polyps, diverticulosis, depression, anxiety, acid reflux disease, gluten enteropathy and obesity, who presents for follow-up consultation of Pamela [name] obstructive sleep apnea, now on CPAP therapy. Pamela [name] is unaccompanied today. I last saw Pamela [name] on 12/11/2014, at which time she reported sleeping better with CPAP. She was less restless and felt better rested. She was able to lose weight. She was off of [name]. She was no longer on Imuran either. She reported stress. I encouraged Pamela [name] to continue to use CPAP regularly.  Today, 06/11/2015: I reviewed Pamela [name] CPAP compliance data from 02/22/2015 through 05/22/2015 which is a total of 90 days during which time she used Pamela [name] machine only 5 days with percent used days greater than 4 hours of only 3%, indicating noncompliance. Residual AHI 0.4 per hour, leaked low, [name] at 12 cm with EPR of 3, average usage of 14 minutes for all days and 4 hours and 13 minutes for days used.   Today, 06/11/2015: She reports that she lost a significant amount of weight since Nov 2015. Pamela [name] weight peaked at 310 [name] November 2015. Since then she stopped Pamela [name]. She felt mood wise that she did better on [name] but weight gain was severe. Since then she has been able to lose almost 100 pounds. She has stopped using his CPAP. She also felt uncomfortable with it. She did not like Pamela [name]. She feels that Pamela [name] was too high. She has had some recurrent headaches [name] Pamela [name]. These are described as bitemporal and bifrontal, non-throbbing, worse with head movements. Pamela [name] primary care physician provided a prescription for meclizine which actually helped. She has some associated nausea and some photophobia with it. It helps to lay still or not  move Pamela [name] head. She also takes over-Pamela-counter headache medication. She currently does not use caffeine on a day-to-day basis. She is on latuda 80 mg daily. She is on Valium as needed. She is also on Wellbutrin and BuSpar. She has been referred to an endocrinologist.  Previously:  I first met Pamela [name] on 08/08/2014 at which time she was referred by Pamela [name] GI physician, due to witnessed apneas noted during a recent colonoscopy procedure. I requested that she return for sleep study. She had a split-night sleep study on 08/09/2014 and underwent over Pamela [name] sleep test results with Pamela [name] [name] detail today. Baseline sleep efficiency was 73.2% with a latency to sleep of 23 minutes and wake after sleep onset of 18.5 minutes with mild sleep fragmentation noted. She had a markedly elevated arousal index due to respiratory events. She had absence of REM sleep during Pamela baseline part of Pamela study. She had moderate to loud snoring. Total AHI was 167 per hour. Baseline oxygen saturation was 92%, nadir was 84%. She was titrated on CPAP. Sleep efficiency was 79.6%. She had an increased percentage of slow-wave sleep and a markedly increased percentage of REM sleep. Average oxygen saturation was 94%, nadir was 89%. No significant PLMS were noted during either part of Pamela study. She was titrated from 5-12 cm with an AHI of 6.8 per hour at Pamela final [name]. I reviewed Pamela [name] compliance data from 08/22/2014 through 09/20/2014 which is a total of 30 days during which time she used Pamela [name] machine 28 days with percent used days greater  than 4 hours at 77%, indicating adequate compliance with an average usage of 7 hours and 46 minutes, [name] at 12 cm with EPR of 3, residual AHI low at 1.5 per hour, and leak low at 9.6 L/m at Pamela 95th percentile. I reviewed Pamela [name] compliance data from 10/23/2014 through 11/21/2014 which is a total of 30 days during which time she used Pamela [name] machine 29 days with percent used days greater than 4 hours at 87%, indicating  very good compliance, average usage of 7 hours and 23 minutes, residual AHI at 0.6 per hour and leak low.  I reviewed Pamela [name] compliance data from 11/07/2014 through 12/06/2014 which is a total of 30 days during which time she used Pamela [name] machine 26 days with percent used days greater than 4 hours at 77%, average usage of 6 hours and 30 minutes, [name] at 12 cm with EPR of 3, residual AHI of 0.8 per hour and leak low at 5.6 L/m at Pamela 95th percentile.  She has a longstanding history of insomnia since childhood and has been on numerous medications for this, including Ambien and Lunesta (did not help), Trazodone and Seroquel (helped, but was not good for Pamela [name] GI illness), Valium did not help and caused HAs. OTC sleep aids did not help, except when she took about 5 benadryl. Klonopin helps, but rarely takes it and is discouraged from taking it daily. Most recently, she has been placed on [name] [name] 2012. She has gained a lot of weight on it. She follows with [name], NP at [facility].   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 She has noted RLS symptoms, especially when Pamela [name]. She has no FHx of OSA or insomnia or RLS.    Pamela [name] typical bedtime is reported to be around 10 PM and usual wake time is around 7:45 to as late as 11 AM. Sleep onset typically occurs within 15 minutes. She reports feeling marginally rested upon awakening. She has daytime drowsy first thing [name] Pamela [name]. She feels tired, but not sleepy. She wakes up on an average 4 times [name] Pamela middle of Pamela night and has to go to Pamela bathroom 2 times on a typical night. She denies morning headaches.   She reports excessive daytime somnolence (EDS) and Pamela [name] Epworth Sleepiness [name] (ESS) is 3/24 today. She has not fallen asleep while driving. Pamela [name] has not been taking a planned nap.   She has been known to snore for Pamela past few years. Snoring is reportedly moderate, and associated with choking sounds and  witnessed apneas. Pamela [name] denies a sense of choking or strangling feeling. There is report of nighttime reflux, with no nighttime cough experienced. She denies cataplexy, sleep paralysis, hypnagogic or hypnopompic hallucinations, or sleep attacks. She does report vivid dreams, no nightmares, dream enactments, or parasomnias, such as sleep talking or sleep walking. Pamela [name] has not had a sleep study or a home sleep test.   She consumes 0 caffeinated beverages per day, stopped smoking [name] June 2015 and and rarely drinks alcohol. There is a TV [name] Pamela [name] bedroom and she watches it at night turns out for falling asleep.    Pamela [name] Past Medical History Is Significant For: Past Medical History  Diagnosis Date  . Crohn's disease   . Hemorrhoids, internal   . History of anal fissures   . Depression   . Anxiety     Pamela [name] Past Surgical History Is Significant For: Past Surgical History  Procedure Laterality Date  . Gallbladder surgery  2006    Pamela [name] Family History Is Significant For: Family History  Problem Relation Age of Onset  . Crohn's disease Maternal Grandmother   . Cancer Paternal Grandfather     stomach    Pamela [name] Social History Is Significant For: History   Social History  . Marital Status: Single    Spouse Name: N/A  . Number of Children: 0  . Years of Education: BS   Occupational History  .      [employer]   Social History Main Topics  . Smoking status: Current Some Day Smoker  . Smokeless tobacco: Never Used     Comment: quit 04/2014,started back smoking 11/2014  . Alcohol Use: 0.0 oz/week    0 Standard drinks or equivalent per week     Comment: one time monthly  . Drug Use: No  . Sexual Activity: Not on file   Other Topics Concern  . None   Social History Narrative   [name] is right handed and resides with roommate, consumes no caffeine.    Pamela [name] Allergies Are:  No Known Allergies:   Pamela [name] Current Medications Are:  Outpatient Encounter Prescriptions as of 06/11/2015   Medication Sig  . buPROPion (WELLBUTRIN XL) 300 MG 24 hr tablet Take by mouth daily.   . busPIRone (BUSPAR) 15 MG tablet Take 15 mg by mouth 3 (three) times daily.   . cyanocobalamin 500 MCG tablet Take 500 mcg by mouth daily.  . diazepam (VALIUM) 5 MG tablet   . FLUoxetine (PROZAC) 40 MG capsule Take 40 mg by mouth daily.  . Lurasidone HCl 20 MG TABS Take by mouth daily. Daily with breakfast  . meclizine (ANTIVERT) 12.5 MG tablet   . Multiple Vitamin (MULTIVITAMIN) capsule Take 1 capsule by mouth daily.  . Probiotic Product (PROBIOTIC FORMULA PO) Take by mouth.   No facility-administered encounter medications on file as of 06/11/2015.  :  Review of Systems:  Out of a complete 14 point review of systems, all are reviewed and negative with Pamela exception of these symptoms as listed below:  Review of Systems  Endocrine:       TSH levels have come back low.   Neurological: Positive for dizziness.       [name] states that she has lost 107 lbs. Has not been using CPAP due to nasal [name] irritating Pamela [name] nose. Asks if she still has OSA?  [name] reports episodes of dizziness and nausea.     Objective:  Neurologic Exam  Physical Exam Physical Examination:   Filed Vitals:   06/11/15 1612  BP: 116/88  Pulse: 90  Resp: 16    General Examination: Pamela [name] is a very pleasant 34 y.o. female [name] no acute distress. She appears well-developed and well-nourished and well groomed. She is now borderline obese and has been able to lose a significant amount of weight since Pamela [name] last visit.   HEENT: Normocephalic, atraumatic, pupils are equal, round and reactive to light and accommodation. Funduscopic exam is normal with sharp disc margins noted. Extraocular tracking is good without limitation to gaze excursion or nystagmus noted. Normal smooth pursuit is noted. Hearing is grossly intact. Face is symmetric with normal facial animation and normal facial sensation. Speech is clear with no dysarthria  noted. There is no hypophonia. There is no lip, neck/head, jaw or voice tremor. Neck is supple with full range of passive and active motion. There are no carotid bruits on auscultation. Oropharynx exam reveals: mouth  dryness, good dental hygiene and moderate airway crowding, due to thicker soft palate and swollen uvula, larger tonsils and enlarged tongue. Mallampati is class II. Tongue protrudes centrally and palate elevates symmetrically. Tonsils are 2-3+ [name] size. Pamela [name] neck circumference is 14-1/2 inches, down from 17-1/2 inches for Pamela first met Pamela [name] [name] September 2015.   Chest: Clear to auscultation without wheezing, rhonchi or crackles noted.  Heart: S1+S2+0, regular and normal without murmurs, rubs or gallops noted.   Abdomen: Soft, non-tender and non-distended with normal bowel sounds appreciated on auscultation.  Extremities: There is no pitting edema [name] Pamela distal lower extremities bilaterally. Pedal pulses are intact.  Skin: Warm and dry without trophic changes noted. There are no varicose veins.  Musculoskeletal: exam reveals no obvious joint deformities, tenderness or joint swelling or erythema.   Neurologically:  Mental status: Pamela [name] is awake, alert and oriented [name] all 4 spheres. Pamela [name] immediate and remote memory, attention, language skills and fund of knowledge are appropriate. There is no evidence of aphasia, agnosia, apraxia or anomia. Speech is clear with normal prosody and enunciation. Thought process is linear. Mood is normal and affect is normal.  Cranial nerves II - XII are as described above under HEENT exam. [name] addition: shoulder shrug is normal with equal shoulder height noted. Motor exam: Normal bulk, strength and tone is noted. There is no drift, tremor or rebound. Romberg is negative. Reflexes are 2+ throughout. Babinski: Toes are flexor bilaterally. Fine motor skills and coordination: intact with normal finger taps, normal hand movements, normal rapid alternating patting,  normal foot taps and normal foot agility.  Cerebellar testing: No dysmetria or intention tremor on finger to nose testing. Heel to shin is unremarkable bilaterally. There is no truncal or gait ataxia.  Sensory exam: intact to light touch, pinprick, vibration, temperature sense [name] Pamela upper and lower extremities.  Gait, station and balance: She stands easily. No veering to one side is noted. No leaning to one side is noted. Posture is age-appropriate and stance is narrow based. Gait shows normal stride length and normal pace. No problems turning are noted. She turns en bloc. Tandem walk is unremarkable.              Assessment and Plan:   [name] summary, MERRIANNE MCCUMBERS is a very pleasant 34 year old female with an underlying medical history of Crohn's disease, hemorrhoids, OCD, colonic polyps, diverticulosis, depression, anxiety, acid reflux disease, gluten enteropathy and obesity, who presents for follow-up consultation of Pamela [name] severe obstructive sleep apnea, for which she was on CPAP at a [name] of 12 cm. She was previously compliant with treatment but has [name] Pamela meantime lost a lot of weight [name] Pamela realm of 100 pounds. Since Pamela [name] original diagnosis [name] September 2015 she may very well have significantly improved Pamela [name] sleep disordered breathing. She still has a crowded appearing airway. She has been having recurrent headaches [name] Pamela [name] or so. Pamela [name] physical and neurological exam are nonfocal. She has morning headaches as well. I would like to reevaluate Pamela [name] for with a sleep study. She has stopped using Pamela [name] CPAP. She may have residual sleep disordered breathing. She is agreeable to getting retested and would be willing to consider restarting CPAP if still needed. To that end, I will schedule Pamela [name] for sleep study and we will see Pamela [name] back afterwards. For Pamela [name] headaches she is advised to use over-Pamela-counter medication. If she has residual sleep disordered breathing and would benefit from CPAP therapy, Pamela [name]  headaches  may improve as well. I answered all Pamela [name] questions today and Pamela [name] was [name] agreement.  I spent 20 minutes [name] total face-to-face time with Pamela [name], more than 50% of which was spent [name] counseling and coordination of care, reviewing test results, reviewing medication and discussing or reviewing Pamela diagnosis of OSA and recurrent headaches, Pamela prognosis and treatment options.

## 2015-06-11 NOTE — Patient Instructions (Addendum)
We will recheck your sleep apnea diagnosis with another sleep study, since you have been able to lose a significant about of weight.

## 2015-11-13 ENCOUNTER — Other Ambulatory Visit: Payer: Self-pay

## 2015-11-13 DIAGNOSIS — R51 Headache: Secondary | ICD-10-CM

## 2015-11-13 DIAGNOSIS — G4733 Obstructive sleep apnea (adult) (pediatric): Secondary | ICD-10-CM

## 2015-11-13 DIAGNOSIS — R634 Abnormal weight loss: Secondary | ICD-10-CM

## 2015-11-13 DIAGNOSIS — R519 Headache, unspecified: Secondary | ICD-10-CM

## 2015-12-07 ENCOUNTER — Ambulatory Visit (INDEPENDENT_AMBULATORY_CARE_PROVIDER_SITE_OTHER): Payer: BC Managed Care – PPO | Admitting: Neurology

## 2015-12-07 DIAGNOSIS — G4733 Obstructive sleep apnea (adult) (pediatric): Secondary | ICD-10-CM

## 2015-12-07 DIAGNOSIS — G4720 Circadian rhythm sleep disorder, unspecified type: Secondary | ICD-10-CM

## 2015-12-08 NOTE — Sleep Study (Signed)
Please see the scanned sleep study interpretation located in the procedure tab within the chart review section.   

## 2015-12-12 ENCOUNTER — Telehealth: Payer: Self-pay | Admitting: *Deleted

## 2015-12-13 ENCOUNTER — Telehealth: Payer: Self-pay | Admitting: Neurology

## 2015-12-13 NOTE — Telephone Encounter (Signed)
LM for patient. Advised no sleep apnea and Dr. Frances Furbish would like to go over report in more detail. Asked her to call back for appt, we have openings this afternoon.

## 2015-12-13 NOTE — Telephone Encounter (Signed)
Patient last seen on 06/11/15, had achieved >100 lb weight loss since original OSA Dx. No longer on CPAP.  Most recent sleep study showed no more OSA.  Can come today or any other time for brief FU to go over results.  Please call pt. thx

## 2016-01-11 NOTE — Telephone Encounter (Addendum)
Pt called in to schedule an appt to discuss results per request. First available is April 20th at 10:30am. She mentioned she doesn't think her results were very accurate and would like a call back to discuss. She also feels her current appt time is to far away. Please call and advise [phone]

## 2016-01-14 NOTE — Telephone Encounter (Signed)
Pamela [name], pls call back and see if we can bring her back sooner, ok to use early morning new patient slot.

## 2016-01-14 NOTE — Telephone Encounter (Signed)
I spoke to patient. There was a cancellation this Wednesday that she was able to take.

## 2016-01-16 ENCOUNTER — Encounter: Payer: Self-pay | Admitting: Neurology

## 2016-01-16 ENCOUNTER — Ambulatory Visit (INDEPENDENT_AMBULATORY_CARE_PROVIDER_SITE_OTHER): Payer: BC Managed Care – PPO | Admitting: Neurology

## 2016-01-16 VITALS — BP 126/80 | HR 88 | Resp 16 | Ht 68.0 in | Wt 228.0 lb

## 2016-01-16 DIAGNOSIS — Z9989 Dependence on other enabling machines and devices: Principal | ICD-10-CM

## 2016-01-16 DIAGNOSIS — G4733 Obstructive sleep apnea (adult) (pediatric): Secondary | ICD-10-CM | POA: Diagnosis not present

## 2016-01-16 NOTE — Patient Instructions (Signed)
We will try you on a smaller CPAP pressure and change your mask to a nasal mask. Your sleep study from 1/17 showed no significant sleep apnea.

## 2016-01-16 NOTE — Progress Notes (Signed)
Subjective:    [name] is a 35 y.o. female.  HPI     Interim history:   Pamela [name] is a 35 year old right-handed woman with an underlying medical history of Crohn's disease, hemorrhoids, OCD, colonic polyps, diverticulosis, depression, anxiety, acid reflux disease, gluten enteropathy and obesity, who presents for follow-up consultation of Pamela [name] obstructive sleep apnea, now on CPAP therapy. Pamela [name] is unaccompanied today. I last saw Pamela [name] on 06/11/2015 at which time she was not using Pamela [name] CPAP machine. She had stopped using it after she achieved a significant amount of weight loss. She had lost over 100 pounds since November 2015. She had stopped taking Zyprexa which helped with Pamela weight loss. She had been having some intermittent headaches, and Pamela [name] primary care physician gave Pamela [name] a prescription for meclizine which helped Pamela [name] headaches. At Pamela time of Pamela [name] last visit with me she was on Latuda 80 mg daily, Valium as needed, Wellbutrin and BuSpar. She reported that she was going to see an endocrinologist. We mutually agreed to bring Pamela [name] back for sleep study to recheck Pamela [name] sleep apnea. She had a baseline sleep study on 12/07/2015. I went over Pamela [name] test results with Pamela [name] in detail today. Sleep efficiency was reduced at 67.3% with a long sleep latency of 134 minutes and wake after sleep onset of 25 minutes with 1 longer period of wakefulness but no significant sleep fragmentation. She had a normal arousal index. She had an increased percentage of stage II sleep, absence of slow-wave sleep and a decreased percentage of REM sleep with a prolonged REM latency. She had no significant PLMS, EKG or EEG changes. She had minimal snoring. Total AHI was 0.4 per hour, average oxygen saturation 94%, nadir was 88% with time below 90% saturation of only 14 seconds. We called Pamela [name] with Pamela [name] test results. She request a follow-up appointment.   Today, 01/16/2016: She reports interim weight gain. She  weighed 204 in August 2016, current weight is 228 lb. she reports that she did not have any significant weight gain since January 2017. She reports residual snoring and Pamela [name] boyfriend has noticed some apneic breathing pauses and irregularities in Pamela [name] breathing. She sometimes wakes up with a startle.   Previously:   I saw Pamela [name] on 12/11/2014, at which time she reported sleeping better with CPAP. She was less restless and felt better rested. She was able to lose weight. She was off of Zyprexa. She was no longer on Imuran either. She reported stress. I encouraged Pamela [name] to continue to use CPAP regularly.  I reviewed Pamela [name] CPAP compliance data from 02/22/2015 through 05/22/2015 which is a total of 90 days during which time she used Pamela [name] machine only 5 days with percent used days greater than 4 hours of only 3%, indicating noncompliance. Residual AHI 0.4 per hour, leaked low, pressure at 12 cm with EPR of 3, average usage of 14 minutes for all days and 4 hours and 13 minutes for days used.   I first met Pamela [name] on 08/08/2014 at which time she was referred by Pamela [name] GI physician, due to witnessed apneas noted during a recent colonoscopy procedure. I requested that she return for sleep study. She had a split-night sleep study on 08/09/2014 and underwent over Pamela [name] sleep test results with Pamela [name] in detail today. Baseline sleep efficiency was 73.2% with a latency to sleep of 23 minutes and wake after sleep onset of 18.5 minutes with mild sleep fragmentation noted. She had a markedly elevated arousal index  due to respiratory events. She had absence of REM sleep during Pamela baseline part of Pamela study. She had moderate to loud snoring. Total AHI was 167 per hour. Baseline oxygen saturation was 92%, nadir was 84%. She was titrated on CPAP. Sleep efficiency was 79.6%. She had an increased percentage of slow-wave sleep and a markedly increased percentage of REM sleep. Average oxygen saturation was 94%, nadir was 89%. No significant PLMS were  noted during either part of Pamela study. She was titrated from 5-12 cm with an AHI of 6.8 per hour at Pamela final pressure. I reviewed Pamela [name] compliance data from 08/22/2014 through 09/20/2014 which is a total of 30 days during which time she used Pamela [name] machine 28 days with percent used days greater than 4 hours at 77%, indicating adequate compliance with an average usage of 7 hours and 46 minutes, pressure at 12 cm with EPR of 3, residual AHI low at 1.5 per hour, and leak low at 9.6 L/m at Pamela 95th percentile. I reviewed Pamela [name] compliance data from 10/23/2014 through 11/21/2014 which is a total of 30 days during which time she used Pamela [name] machine 29 days with percent used days greater than 4 hours at 87%, indicating very good compliance, average usage of 7 hours and 23 minutes, residual AHI at 0.6 per hour and leak low.  I reviewed Pamela [name] compliance data from 11/07/2014 through 12/06/2014 which is a total of 30 days during which time she used Pamela [name] machine 26 days with percent used days greater than 4 hours at 77%, average usage of 6 hours and 30 minutes, pressure at 12 cm with EPR of 3, residual AHI of 0.8 per hour and leak low at 5.6 L/m at Pamela 95th percentile.  She has a longstanding history of insomnia since childhood and has been on numerous medications for this, including Ambien and Lunesta (did not help), Trazodone and Seroquel (helped, but was not good for Pamela [name] GI illness), Valium did not help and caused HAs. OTC sleep aids did not help, except when she took about 5 benadryl. Klonopin helps, but rarely takes it and is discouraged from taking it daily. Most recently, she has been placed on Zyprexa in 2012. She has gained a lot of weight on it. She follows with [name], NP at [facility].   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 She has noted RLS symptoms, especially when Pamela Zyprexa kicks in. She has no FHx of OSA or insomnia or RLS.    Pamela [name] typical bedtime is reported to be around 10 PM  and usual wake time is around 7:45 to as late as 11 AM. Sleep onset typically occurs within 15 minutes. She reports feeling marginally rested upon awakening. She has daytime drowsy first thing in Pamela mornings. She feels tired, but not sleepy. She wakes up on an average 4 times in Pamela middle of Pamela night and has to go to Pamela bathroom 2 times on a typical night. She denies morning headaches.   She reports excessive daytime somnolence (EDS) and Pamela [name] Epworth Sleepiness Score (ESS) is 3/24 today. She has not fallen asleep while driving. Pamela [name] has not been taking a planned nap.   She has been known to snore for Pamela past few years. Snoring is reportedly moderate, and associated with choking sounds and witnessed apneas. Pamela [name] denies a sense of choking or strangling feeling. There is report of nighttime reflux, with no nighttime cough experienced. She denies cataplexy, sleep paralysis, hypnagogic  or hypnopompic hallucinations, or sleep attacks. She does report vivid dreams, no nightmares, dream enactments, or parasomnias, such as sleep talking or sleep walking. Pamela [name] has not had a sleep study or a home sleep test.   She consumes 0 caffeinated beverages per day, stopped smoking in June 2015 and and rarely drinks alcohol. There is a TV in Pamela [name] bedroom and she watches it at night turns out for falling asleep.  Pamela [name] Past Medical History Is Significant For: Past Medical History  Diagnosis Date  . Crohn's disease ([place])   . Hemorrhoids, internal   . History of anal fissures   . Depression   . Anxiety     Pamela [name] Past Surgical History Is Significant For: Past Surgical History  Procedure Laterality Date  . Gallbladder surgery  2006    Pamela [name] Family History Is Significant For: Family History  Problem Relation Age of Onset  . Crohn's disease Maternal Grandmother   . Cancer Paternal Grandfather     stomach    Pamela [name] Social History Is Significant For: Social History   Social History  . Marital  Status: Single    Spouse Name: N/A  . Number of Children: 0  . Years of Education: BS   Occupational History  .      [employer]   Social History Main Topics  . Smoking status: Current Some Day Smoker  . Smokeless tobacco: Never Used     Comment: quit 04/2014,started back smoking 11/2014  . Alcohol Use: 0.0 oz/week    0 Standard drinks or equivalent per week     Comment: one time monthly  . Drug Use: No  . Sexual Activity: Not Asked   Other Topics Concern  . None   Social History Narrative   [name] is right handed and resides with roommate, consumes no caffeine.    Pamela [name] Allergies Are:  No Known Allergies:   Pamela [name] Current Medications Are:  Outpatient Encounter Prescriptions as of 01/16/2016  Medication Sig  . Asenapine Maleate (SAPHRIS) 10 MG SUBL Place under Pamela tongue.  Marland Kitchen buPROPion (WELLBUTRIN XL) 300 MG 24 hr tablet Take by mouth daily.   . busPIRone (BUSPAR) 15 MG tablet Take 15 mg by mouth 3 (three) times daily.   Marland Kitchen FLUoxetine (PROZAC) 40 MG capsule Take 40 mg by mouth daily.  . meclizine (ANTIVERT) 12.5 MG tablet   . Multiple Vitamin (MULTIVITAMIN) capsule Take 1 capsule by mouth daily.  . Probiotic Product (PROBIOTIC FORMULA PO) Take by mouth.  Marland Kitchen VYVANSE 40 MG capsule   . [DISCONTINUED] cyanocobalamin 500 MCG tablet Take 500 mcg by mouth daily.  . [DISCONTINUED] diazepam (VALIUM) 5 MG tablet   . [DISCONTINUED] Lurasidone HCl 20 MG TABS Take by mouth daily. Daily with breakfast   No facility-administered encounter medications on file as of 01/16/2016.  :  Review of Systems:  Out of a complete 14 point review of systems, all are reviewed and negative with Pamela exception of these symptoms as listed below:   Review of Systems  Neurological:       [name] is here to discuss sleep study. No new concerns.     Objective:  Neurologic Exam  Physical Exam Physical Examination:   Filed Vitals:   01/16/16 1616  BP: 126/80  Pulse: 88  Resp: 16   General Examination: Pamela  [name] is a very pleasant 35 y.o. female in no acute distress. She appears well-developed and well-nourished and well groomed. She has gained some weight since August 2016.  HEENT:  Normocephalic, atraumatic, pupils are equal, round and reactive to light and accommodation. Extraocular tracking is good without limitation to gaze excursion or nystagmus noted. Normal smooth pursuit is noted. Hearing is grossly intact. Face is symmetric with normal facial animation and normal facial sensation. Speech is clear with no dysarthria noted. There is no hypophonia. There is no lip, neck/head, jaw or voice tremor. Neck is supple with full range of passive and active motion. There are no carotid bruits on auscultation. Oropharynx exam reveals: mouth dryness, good dental hygiene and moderate airway crowding, due to thicker soft palate and largish uvula, larger tonsils and larger appearing tongue. Mallampati is class II. Tongue protrudes centrally and palate elevates symmetrically. Tonsils are 2-3+ in size.   Chest: Clear to auscultation without wheezing, rhonchi or crackles noted.  Heart: S1+S2+0, regular and normal without murmurs, rubs or gallops noted.   Abdomen: Soft, non-tender and non-distended with normal bowel sounds appreciated on auscultation.  Extremities: There is no pitting edema in Pamela distal lower extremities bilaterally. Pedal pulses are intact.  Skin: Warm and dry without trophic changes noted. There are no varicose veins.  Musculoskeletal: exam reveals no obvious joint deformities, tenderness or joint swelling or erythema.   Neurologically:  Mental status: Pamela [name] is awake, alert and oriented in all 4 spheres. Pamela [name] immediate and remote memory, attention, language skills and fund of knowledge are appropriate. There is no evidence of aphasia, agnosia, apraxia or anomia. Speech is clear with normal prosody and enunciation. Thought process is linear. Mood is normal and affect is normal.  Cranial  nerves II - XII are as described above under HEENT exam. In addition: shoulder shrug is normal with equal shoulder height noted. Motor exam: Normal bulk, strength and tone is noted. There is no drift, tremor or rebound. Romberg is negative. Reflexes are 2+ throughout. Fine motor skills and coordination: intact in Pamela UEs and LEs.   Cerebellar testing: No dysmetria or intention tremor on finger to nose testing. Heel to shin is unremarkable bilaterally. There is no truncal or gait ataxia.  Sensory exam: intact to light touch in Pamela upper and lower extremities.  Gait, station and balance: She stands easily. No veering to one side is noted. No leaning to one side is noted. Posture is age-appropriate and stance is narrow based. Gait shows normal stride length and normal pace. No problems turning are noted. She turns en bloc. Tandem walk is unremarkable.              Assessment and Plan:   In summary, BRENEE GAJDA is a very pleasant 35 year old female with an underlying medical history of Crohn's disease, hemorrhoids, OCD, colonic polyps, diverticulosis, depression, anxiety, acid reflux disease, gluten enteropathy and obesity, who presents for follow-up consultation of Pamela [name] Obstructive sleep apnea for which she was on CPAP therapy at a pressure of 12 cm in Pamela past. She was previously compliant with treatment but then lost a lot of weight in Pamela realm of 100 pounds. Pamela [name] original diagnosis of obstructive sleep apnea was in September 2015. She quit using Pamela [name] CPAP last year. At Pamela time of Pamela [name] office visit in August 2016 she weighed 204 pounds. She then came back for a baseline sleep study on 12/07/2015 which showed no significant sleep disordered breathing. However, she has had interim weight gain and current weight is 228 pounds. Pamela [name] physical exam otherwise is stable. I talked to Pamela [name] about Pamela [name] sleep test results in detail. She reports snoring and some breathing  pauses as reported to Pamela [name] by Pamela [name] boyfriend. She  feels that Pamela current CPAP pressure of 12 cm is too much. She would like to try a smaller pressure and also change Pamela [name] nasal pillows to a nasal mask. We mutually agreed to try Pamela [name] on a CPAP pressure of 6 cm based on Pamela [name] prior diagnosis of severe obstructive sleep apnea and even though Pamela [name] latest sleep study from January 2017 showed no significant sleep disordered breathing, she has some residual symptoms and since she has a CPAP machine we can certainly try Pamela [name] on a smaller CPAP pressure and see how she feels. She was agreeable to this. I will see Pamela [name] back in 3 months, sooner if Pamela need arises. I encouraged Pamela [name] to call or email with any interim questions or concerns. I placed an order in Pamela [name] chart for CPAP pressure reduction and mask refit for nasal mask and a 30 day compliance data to be faxed to me.  I answered all Pamela [name] questions today and Pamela [name] was in agreement. We also compared findings from Pamela [name] original split-night sleep study from September 2015 and Pamela [name] January 2017 baseline sleep study. I spent 25 minutes in total face-to-face time with Pamela [name], more than 50% of which was spent in counseling and coordination of care, reviewing test results, reviewing medication and discussing or reviewing Pamela diagnosis of OSA, Pamela prognosis and treatment options.

## 2016-02-28 ENCOUNTER — Ambulatory Visit: Payer: BC Managed Care – PPO | Admitting: Neurology

## 2016-04-16 ENCOUNTER — Telehealth: Payer: Self-pay

## 2016-04-16 NOTE — Telephone Encounter (Signed)
Left message with appt time tomorrow and reminder to bring SD card to appt so we can see how she is doing on CPAP.

## 2016-04-17 ENCOUNTER — Ambulatory Visit (INDEPENDENT_AMBULATORY_CARE_PROVIDER_SITE_OTHER): Payer: BC Managed Care – PPO | Admitting: Neurology

## 2016-04-17 ENCOUNTER — Encounter: Payer: Self-pay | Admitting: Neurology

## 2016-04-17 VITALS — BP 124/82 | HR 98 | Resp 16 | Ht 68.0 in | Wt 251.0 lb

## 2016-04-17 DIAGNOSIS — R635 Abnormal weight gain: Secondary | ICD-10-CM

## 2016-04-17 DIAGNOSIS — G4733 Obstructive sleep apnea (adult) (pediatric): Secondary | ICD-10-CM | POA: Diagnosis not present

## 2016-04-17 NOTE — Patient Instructions (Signed)
Unfortunately, you have not been able to tolerate CPAP. I know, you have tried and were willing to try it again. As discussed, I will send a referral to your dentist, Dr. Rollen Sox for consideration of a dental appliance to treat obstructive sleep apnea. We will attach your sleep study results from 08/09/2014. If a more recent sleep study is needed and as your symptoms have worsened it may be necessary for you to come back for yet another sleep study, especially in light of your weight fluctuations. Please contact us if you need additional advice. For now, we will played by ear and wait for you to contact our office. I would be happy to see you again and we may pursue another sleep study in the near future as needed.

## 2016-04-17 NOTE — Progress Notes (Signed)
Subjective:    Patient ID: Pamela [name] is a 35 y.o. female.  HPI     Interim history:  Pamela [name] is a 35 year old right-handed woman with an underlying medical history of Crohn's disease, hemorrhoids, OCD, colonic polyps, diverticulosis, depression, anxiety, acid reflux disease, gluten enteropathy and obesity, who presents for follow-up consultation of Pamela [name] obstructive sleep apnea, on CPAP therapy. The patient is unaccompanied today. I last saw Pamela [name] on 01/16/2016, at which time she reported interim weight gain. We talked about Pamela [name] most recent baseline sleep study results in detail and this did not show any significant sleep disordered breathing after Pamela [name] significant weight loss. Nevertheless, she reported recurrence of some of Pamela [name] OSA symptoms but intolerance of Pamela [name] CPAP pressure from before at 12 cm. We mutually agreed to reduce Pamela [name] pressure and see how she felt. I suggested a pressure of 6 cm and we would monitor Pamela [name] compliance data.   Today, 04/17/2016: She reports that she was unable to tolerate CPAP. She tried it and also unfortunately has gained weight in the interim. She has an eating disorder and is working with a doctor on [facility]. She has been snoring loudly. She has been told by Pamela [name] family that she makes abnormal sounds in Pamela [name] sleep and does not wake up rested. She did give CPAP a valid try and was willing to retry it but it has just not worked out. Pamela [name] sleep study in September 2015 showed significant obstructive sleep apnea. Pamela [name] weight has been fluctuating quite a bit. She would like to try a dental appliance. In order to do so she needs a referral. Pamela [name] own dentist does provide dental appliances. She would prefer to see him for this.   Previously:  I saw Pamela [name] on 06/11/2015 at which time she was not using Pamela [name] CPAP machine. She had stopped using it after she achieved a significant amount of weight loss. She had lost over 100 pounds since November 2015. She had stopped  taking Zyprexa which helped with the weight loss. She had been having some intermittent headaches, and Pamela [name] primary care physician gave Pamela [name] a prescription for meclizine which helped Pamela [name] headaches. At the time of Pamela [name] last visit with me she was on Latuda 80 mg daily, Valium as needed, Wellbutrin and BuSpar. She reported that she was going to see an endocrinologist. We mutually agreed to bring Pamela [name] back for sleep study to recheck Pamela [name] sleep apnea. She had a baseline sleep study on 12/07/2015. I went over Pamela [name] test results with Pamela [name] in detail today. Sleep efficiency was reduced at 67.3% with a long sleep latency of 134 minutes and wake after sleep onset of 25 minutes with 1 longer period of wakefulness but no significant sleep fragmentation. She had a normal arousal index. She had an increased percentage of stage II sleep, absence of slow-wave sleep and a decreased percentage of REM sleep with a prolonged REM latency. She had no significant PLMS, EKG or EEG changes. She had minimal snoring. Total AHI was 0.4 per hour, average oxygen saturation 94%, nadir was 88% with time below 90% saturation of only 14 seconds. We called Pamela [name] with Pamela [name] test results. She request a follow-up appointment.   I saw Pamela [name] on 12/11/2014, at which time she reported sleeping better with CPAP. She was less restless and felt better rested. She was able to lose weight. She was off of Zyprexa. She was no longer on Imuran either. She reported stress. I encouraged Pamela [name] to continue to use CPAP  regularly.  I reviewed Pamela [name] CPAP compliance data from 02/22/2015 through 05/22/2015 which is a total of 90 days during which time she used Pamela [name] machine only 5 days with percent used days greater than 4 hours of only 3%, indicating noncompliance. Residual AHI 0.4 per hour, leaked low, pressure at 12 cm with EPR of 3, average usage of 14 minutes for all days and 4 hours and 13 minutes for days used.   I first met Pamela [name] on 08/08/2014 at which time she was referred by Pamela [name]  GI physician, due to witnessed apneas noted during a recent colonoscopy procedure. I requested that she return for sleep study. She had a split-night sleep study on 08/09/2014 and underwent over Pamela [name] sleep test results with Pamela [name] in detail today. Baseline sleep efficiency was 73.2% with a latency to sleep of 23 minutes and wake after sleep onset of 18.5 minutes with mild sleep fragmentation noted. She had a markedly elevated arousal index due to respiratory events. She had absence of REM sleep during the baseline part of the study. She had moderate to loud snoring. Total AHI was 167 per hour. Baseline oxygen saturation was 92%, nadir was 84%. She was titrated on CPAP. Sleep efficiency was 79.6%. She had an increased percentage of slow-wave sleep and a markedly increased percentage of REM sleep. Average oxygen saturation was 94%, nadir was 89%. No significant PLMS were noted during either part of the study. She was titrated from 5-12 cm with an AHI of 6.8 per hour at the final pressure. I reviewed Pamela [name] compliance data from 08/22/2014 through 09/20/2014 which is a total of 30 days during which time she used Pamela [name] machine 28 days with percent used days greater than 4 hours at 77%, indicating adequate compliance with an average usage of 7 hours and 46 minutes, pressure at 12 cm with EPR of 3, residual AHI low at 1.5 per hour, and leak low at 9.6 L/m at the 95th percentile. I reviewed Pamela [name] compliance data from 10/23/2014 through 11/21/2014 which is a total of 30 days during which time she used Pamela [name] machine 29 days with percent used days greater than 4 hours at 87%, indicating very good compliance, average usage of 7 hours and 23 minutes, residual AHI at 0.6 per hour and leak low.  I reviewed Pamela [name] compliance data from 11/07/2014 through 12/06/2014 which is a total of 30 days during which time she used Pamela [name] machine 26 days with percent used days greater than 4 hours at 77%, average usage of 6 hours and 30 minutes, pressure at 12  cm with EPR of 3, residual AHI of 0.8 per hour and leak low at 5.6 L/m at the 95th percentile.  She has a longstanding history of insomnia since childhood and has been on numerous medications for this, including Ambien and Lunesta (did not help), Trazodone and Seroquel (helped, but was not good for Pamela [name] GI illness), Valium did not help and caused HAs. OTC sleep aids did not help, except when she took about 5 benadryl. Klonopin helps, but rarely takes it and is discouraged from taking it daily. Most recently, she has been placed on Zyprexa in 2012. She has gained a lot of weight on it. She follows with [name], NP at [facility].   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 She has noted RLS symptoms, especially when the Zyprexa kicks in. She has no FHx of OSA or insomnia or RLS.    Pamela [name] typical bedtime is reported  to be around 10 PM and usual wake time is around 7:45 to as late as 11 AM. Sleep onset typically occurs within 15 minutes. She reports feeling marginally rested upon awakening. She has daytime drowsy first thing in the mornings. She feels tired, but not sleepy. She wakes up on an average 4 times in the middle of the night and has to go to the bathroom 2 times on a typical night. She denies morning headaches.   She reports excessive daytime somnolence (EDS) and Pamela [name] Epworth Sleepiness Score (ESS) is 3/24 today. She has not fallen asleep while driving. The patient has not been taking a planned nap.   She has been known to snore for the past few years. Snoring is reportedly moderate, and associated with choking sounds and witnessed apneas. The patient denies a sense of choking or strangling feeling. There is report of nighttime reflux, with no nighttime cough experienced. She denies cataplexy, sleep paralysis, hypnagogic or hypnopompic hallucinations, or sleep attacks. She does report vivid dreams, no nightmares, dream enactments, or parasomnias, such as sleep talking or sleep  walking. The patient has not had a sleep study or a home sleep test.   She consumes 0 caffeinated beverages per day, stopped smoking in June 2015 and and rarely drinks alcohol. There is a TV in Pamela [name] bedroom and she watches it at night turns out for falling asleep.   Pamela [name] Past Medical History Is Significant For: Past Medical History  Diagnosis Date  . Crohn's disease ([place])   . Hemorrhoids, internal   . History of anal fissures   . Depression   . Anxiety     Pamela [name] Past Surgical History Is Significant For: Past Surgical History  Procedure Laterality Date  . Gallbladder surgery  2006    Pamela [name] Family History Is Significant For: Family History  Problem Relation Age of Onset  . Crohn's disease Maternal Grandmother   . Cancer Paternal Grandfather     stomach    Pamela [name] Social History Is Significant For: Social History   Social History  . Marital Status: Single    Spouse Name: N/A  . Number of Children: 0  . Years of Education: BS   Occupational History  .      [employer]   Social History Main Topics  . Smoking status: Former Research scientist (life sciences)  . Smokeless tobacco: Never Used     Comment: quit 04/2014,started back smoking 11/2014  . Alcohol Use: 0.0 oz/week    0 Standard drinks or equivalent per week     Comment: one time monthly  . Drug Use: No  . Sexual Activity: Not Asked   Other Topics Concern  . None   Social History Narrative   Patient is right handed and resides with roommate, consumes no caffeine.    Pamela [name] Allergies Are:  No Known Allergies:   Pamela [name] Current Medications Are:  Outpatient Encounter Prescriptions as of 04/17/2016  Medication Sig  . buPROPion (WELLBUTRIN XL) 300 MG 24 hr tablet Take by mouth daily.   . busPIRone (BUSPAR) 15 MG tablet Take 15 mg by mouth 3 (three) times daily.   . clonazePAM (KLONOPIN) 0.5 MG tablet TK 1/2 TO 1 T PO QD PRA  . FLUoxetine (PROZAC) 40 MG capsule Take 40 mg by mouth daily.  . meclizine (ANTIVERT) 12.5 MG tablet   . Multiple Vitamin  (MULTIVITAMIN) capsule Take 1 capsule by mouth daily.  Marland Kitchen NUVARING 0.12-0.015 MG/24HR vaginal ring   . OLANZapine-FLUoxetine (SYMBYAX) 6-50 MG capsule   .  Probiotic Product (PROBIOTIC FORMULA PO) Take by mouth.  Marland Kitchen VYVANSE 50 MG capsule TK 1 C PO QD  . [DISCONTINUED] Asenapine Maleate (SAPHRIS) 10 MG SUBL Place under the tongue.  . [DISCONTINUED] VYVANSE 40 MG capsule    No facility-administered encounter medications on file as of 04/17/2016.  :  Review of Systems:  Out of a complete 14 point review of systems, all are reviewed and negative with the exception of these symptoms as listed below:   Review of Systems  Neurological:       Patient states that she only used Pamela [name] CPAP for a couple of nights and has not used it since then. It was uncomfortable and reports that she cannot tolerate it. She is interested in getting a dental appliance. Pamela [name] dentist does make them. She is asking for help to get this through Pamela [name] medical insurance.     Objective:  Neurologic Exam  Physical Exam Physical Examination:   Filed Vitals:   04/17/16 1640  BP: 124/82  Pulse: 98  Resp: 16   General Examination: The patient is a very pleasant 35 y.o. female in no acute distress. She appears well-developed and well-nourished and well groomed. She has gained quite a bit of weight.   HEENT: Normocephalic, atraumatic, pupils are equal, round and reactive to light and accommodation. Extraocular tracking is good without limitation to gaze excursion or nystagmus noted. Normal smooth pursuit is noted. Hearing is grossly intact. Face is symmetric with normal facial animation and normal facial sensation. Speech is clear with no dysarthria noted. There is no hypophonia. There is no lip, neck/head, jaw or voice tremor. Neck is supple with full range of passive and active motion. There are no carotid bruits on auscultation. Oropharynx exam reveals: mouth dryness, good dental hygiene and moderate airway crowding, due to thicker  soft palate and largish uvula, larger tonsils and larger appearing tongue. Mild pharyngeal erythema is noted. She has mild nasal congestion and mildly congested speech.     Chest: Clear to auscultation without wheezing, rhonchi or crackles noted.  Heart: S1+S2+0, regular and normal without murmurs, rubs or gallops noted.   Abdomen: Soft, non-tender and non-distended with normal bowel sounds appreciated on auscultation.  Extremities: There is no pitting edema in the distal lower extremities bilaterally. Pedal pulses are intact.  Skin: Warm and dry without trophic changes noted. There are no varicose veins.  Musculoskeletal: exam reveals no obvious joint deformities, tenderness or joint swelling or erythema.   Neurologically:  Mental status: The patient is awake, alert and oriented in all 4 spheres. Pamela [name] immediate and remote memory, attention, language skills and fund of knowledge are appropriate. There is no evidence of aphasia, agnosia, apraxia or anomia. Speech is clear with normal prosody and enunciation. Thought process is linear. Mood is normal and affect is normal.  Cranial nerves II - XII are as described above under HEENT exam. In addition: shoulder shrug is normal with equal shoulder height noted. Motor exam: Normal bulk, strength and tone is noted. There is no drift, tremor or rebound. Romberg is negative. Reflexes are 2+ throughout. Fine motor skills and coordination: intact in the UEs and LEs.   Cerebellar testing: No dysmetria or intention tremor on finger to nose testing. Heel to shin is unremarkable bilaterally. There is no truncal or gait ataxia.  Sensory exam: intact to light touch in the upper and lower extremities.  Gait, station and balance: She stands easily. No veering to one side is noted. No leaning to one  side is noted. Posture is age-appropriate and stance is narrow based. Gait shows normal stride length and normal pace. No problems turning are noted. Tandem walk is  unremarkable.              Assessment and Plan:   In summary, COPPER KIRTLEY is a very pleasant 35 year old female with an underlying medical history of Crohn's disease, hemorrhoids, OCD, colonic polyps, diverticulosis, depression, anxiety, acid reflux disease, gluten enteropathy and obesity, who presents for follow-up consultation of Pamela [name] Previously diagnosed obstructive sleep apnea, during Pamela [name] sleep study in 2017 she was diagnosed with severe obstructive sleep apnea. She tried CPAP for quite some time, stopped using it after she was able to lose weight. We we tried this at a lower pressure but she could not tolerate it any longer. She did give it a valid trial in my opinion. We mutually agreed to see if she is a candidate for dental appliance. She would be willing to talk to Pamela [name] own dentist about it. I made a referral in that regard. I would be happy to see Pamela [name] again if needed. We may want to consider redoing another sleep study because she has had quite a bit of weight fluctuations. She would be willing to come back for sleep study if this is necessary. I appreciate Pamela [name] trying hard to feel better. Pamela [name] snoring is loud and disturbs others that she does not wake up rested. She has signs and symptoms that were in line with Pamela [name] previous diagnosis of OSA from September 201. At this juncture, I will see Pamela [name] back as needed. She will get in touch with me as needed, in particular if she needs more information or Pamela [name] dentist needs more information or if we need to consider bringing Pamela [name] back for another sleep study in the near future. I answered all Pamela [name] questions today and the patient was in agreement with the plan.  I spent 25 minutes in total face-to-face time with the patient, more than 50% of which was spent in counseling and coordination of care, reviewing test results, reviewing medication and discussing or reviewing the diagnosis of OSA, the prognosis and treatment options.

## 2016-11-12 ENCOUNTER — Encounter: Payer: Self-pay | Admitting: Family Medicine

## 2017-03-19 ENCOUNTER — Other Ambulatory Visit: Payer: Self-pay | Admitting: Gastroenterology

## 2017-03-19 DIAGNOSIS — K5000 Crohn's disease of small intestine without complications: Secondary | ICD-10-CM

## 2017-03-19 DIAGNOSIS — R1012 Left upper quadrant pain: Secondary | ICD-10-CM

## 2017-03-19 DIAGNOSIS — R197 Diarrhea, unspecified: Secondary | ICD-10-CM

## 2017-03-24 ENCOUNTER — Ambulatory Visit
Admission: RE | Admit: 2017-03-24 | Discharge: 2017-03-24 | Disposition: A | Discharge status: Home / Self Care | Payer: BC Managed Care – PPO | Source: Ambulatory Visit | Attending: Gastroenterology | Admitting: Gastroenterology

## 2017-03-24 DIAGNOSIS — K5000 Crohn's disease of small intestine without complications: Secondary | ICD-10-CM

## 2017-03-24 DIAGNOSIS — R1012 Left upper quadrant pain: Secondary | ICD-10-CM

## 2017-03-24 DIAGNOSIS — R197 Diarrhea, unspecified: Secondary | ICD-10-CM

## 2017-03-24 MED ORDER — IOPAMIDOL (ISOVUE-300) INJECTION 61%
125.0000 mL | Freq: Once | INTRAVENOUS | Status: AC | PRN
  Administered 2017-03-24: 125 mL via INTRAVENOUS

## 2017-07-12 ENCOUNTER — Encounter (HOSPITAL_COMMUNITY): Payer: Self-pay | Admitting: *Deleted

## 2017-07-12 ENCOUNTER — Emergency Department (HOSPITAL_COMMUNITY)
Admission: EM | Admit: 2017-07-12 | Discharge: 2017-07-12 | Disposition: A | Discharge status: Home / Self Care | Payer: BC Managed Care – PPO | Attending: Emergency Medicine | Admitting: Emergency Medicine

## 2017-07-12 DIAGNOSIS — J36 Peritonsillar abscess: Secondary | ICD-10-CM

## 2017-07-12 DIAGNOSIS — Z79899 Other long term (current) drug therapy: Secondary | ICD-10-CM | POA: Diagnosis not present

## 2017-07-12 DIAGNOSIS — J029 Acute pharyngitis, unspecified: Secondary | ICD-10-CM | POA: Diagnosis present

## 2017-07-12 DIAGNOSIS — F17200 Nicotine dependence, unspecified, uncomplicated: Secondary | ICD-10-CM | POA: Insufficient documentation

## 2017-07-12 LAB — BASIC METABOLIC PANEL
ANION GAP: 11 (ref 5–15)
BUN: 7 mg/dL (ref 6–20)
CHLORIDE: 102 mmol/L (ref 101–111)
CO2: 25 mmol/L (ref 22–32)
Calcium: 9.2 mg/dL (ref 8.9–10.3)
Creatinine, Ser: 0.75 mg/dL (ref 0.44–1.00)
GFR calc Af Amer: >60 mL/min (ref >60)
Glucose, Bld: 90 mg/dL (ref 65–99)
POTASSIUM: 4 mmol/L (ref 3.5–5.1)
Sodium: 138 mmol/L (ref 135–145)

## 2017-07-12 LAB — MONONUCLEOSIS SCREEN: Mono Screen: NEGATIVE

## 2017-07-12 LAB — CBC WITH DIFFERENTIAL/PLATELET
Basophils Absolute: 0.1 K/uL (ref 0.0–0.1)
Basophils Relative: 1 %
Eosinophils Absolute: 0.1 K/uL (ref 0.0–0.7)
Eosinophils Relative: 1 %
HCT: 41.5 % (ref 36.0–46.0)
Hemoglobin: 14.2 g/dL (ref 12.0–15.0)
Lymphocytes Relative: 15 %
Lymphs Abs: 1.7 K/uL (ref 0.7–4.0)
MCH: 30.9 pg (ref 26.0–34.0)
MCHC: 34.2 g/dL (ref 30.0–36.0)
MCV: 90.4 fL (ref 78.0–100.0)
Monocytes Absolute: 1.1 K/uL — ABNORMAL HIGH (ref 0.1–1.0)
Monocytes Relative: 10 %
Neutro Abs: 8.5 K/uL — ABNORMAL HIGH (ref 1.7–7.7)
Neutrophils Relative %: 73 %
Platelets: 419 K/uL — ABNORMAL HIGH (ref 150–400)
RBC: 4.59 MIL/uL (ref 3.87–5.11)
RDW: 13 % (ref 11.5–15.5)
WBC: 11.5 K/uL — ABNORMAL HIGH (ref 4.0–10.5)

## 2017-07-12 MED ORDER — LIDOCAINE VISCOUS 2 % MT SOLN: 10.0000 mL | OROMUCOSAL | 0 refills | Status: DC | PRN

## 2017-07-12 MED ORDER — OXYCODONE-ACETAMINOPHEN 5-325 MG/5ML PO SOLN: 5.0000 mL | ORAL | 0 refills | Status: DC | PRN

## 2017-07-12 MED ORDER — CLINDAMYCIN HCL 300 MG PO CAPS: 300.0000 mg | ORAL_CAPSULE | Freq: Four times a day (QID) | ORAL | 0 refills | Status: DC

## 2017-07-12 MED ORDER — OXYCODONE-ACETAMINOPHEN 5-325 MG PO TABS
1.0000 | ORAL_TABLET | Freq: Four times a day (QID) | ORAL | 0 refills | Status: DC | PRN
Start: 2017-07-12 — End: 2017-10-22

## 2017-07-12 MED ORDER — SODIUM CHLORIDE 0.9 % IV SOLN
Freq: Once | INTRAVENOUS | Status: AC
Start: 2017-07-12 — End: 2017-07-12
  Administered 2017-07-12: 13:00:00 via INTRAVENOUS

## 2017-07-12 MED ORDER — ONDANSETRON HCL 4 MG/2ML IJ SOLN
4.0000 mg | Freq: Once | INTRAMUSCULAR | Status: AC
  Administered 2017-07-12: 4 mg via INTRAVENOUS
  Filled 2017-07-12: qty 2

## 2017-07-12 MED ORDER — DEXAMETHASONE SODIUM PHOSPHATE 10 MG/ML IJ SOLN
10.0000 mg | Freq: Once | INTRAMUSCULAR | Status: AC
  Administered 2017-07-12: 10 mg via INTRAVENOUS
  Filled 2017-07-12: qty 1

## 2017-07-12 MED ORDER — HYDROMORPHONE HCL 1 MG/ML IJ SOLN
0.5000 mg | Freq: Once | INTRAMUSCULAR | Status: AC
  Administered 2017-07-12: 0.5 mg via INTRAVENOUS
  Filled 2017-07-12: qty 1

## 2017-07-12 MED ORDER — HYDROMORPHONE HCL 1 MG/ML IJ SOLN
0.5000 mg | Freq: Once | INTRAMUSCULAR | Status: AC
  Administered 2017-07-12: 0.5 mg via INTRAVENOUS

## 2017-07-12 MED ORDER — CLINDAMYCIN PHOSPHATE 900 MG/50ML IV SOLN
900.0000 mg | Freq: Once | INTRAVENOUS | Status: AC
  Administered 2017-07-12: 900 mg via INTRAVENOUS
  Filled 2017-07-12: qty 50

## 2017-07-12 NOTE — ED Triage Notes (Signed)
NP at bedside.

## 2017-07-12 NOTE — ED Provider Notes (Addendum)
[facility]-EMERGENCY DEPT Provider Note   CSN: [id number] Arrival date & time: 07/12/17  5784     History   Chief Complaint Chief Complaint  Patient presents with  . Sore Throat    HPI Pamela [name] is a 36 y.o. female with hx of Crohn's disease and depression who presents to the ED with sore throat. Patient reports going to Urgent Care first and being sent to the ED for evaluation of tonsillar abscess. The patient reports they did a rapid strep screen that was negative. Patient reports having had a sinus infection 4 weeks ago and took 7 days of Augmentin. She is not sure she got completely over that before this started. The pain is worse on the left side  The history is provided by the patient. No language interpreter was used.  Sore Throat  This is a new problem. The current episode started 2 days ago. The problem occurs constantly. The problem has been gradually worsening. Associated symptoms include headaches and shortness of breath. Pertinent negatives include no abdominal pain. Chest pain: with cough. The symptoms are aggravated by swallowing. Nothing relieves the symptoms. She has tried acetaminophen for the symptoms. The treatment provided no relief.    Past Medical History:  Diagnosis Date  . Anxiety   . Crohn's disease (HCC)   . Depression   . Hemorrhoids, internal   . History of anal fissures     Patient Active Problem List   Diagnosis Date Noted  . Rectal bleeding 05/10/2014    Past Surgical History:  Procedure Laterality Date  . GALLBLADDER SURGERY  2006    OB History    No data available       Home Medications    Prior to Admission medications   Medication Sig Start Date End Date Taking? Authorizing Provider  buPROPion (WELLBUTRIN XL) 300 MG 24 hr tablet Take by mouth daily.  11/21/14  Yes [provider]  Cariprazine HCl (VRAYLAR) 3 MG CAPS Take 3 mg by mouth daily.   Yes [provider]  clonazePAM (KLONOPIN) 0.5 MG tablet TK 1/2  TO 1 T PO QD PRA 03/20/16  Yes [provider]  FLUoxetine (PROZAC) 40 MG capsule Take 40 mg by mouth daily.   Yes [provider]  Levonorgestrel 19.5 MG IUD 1 Dose by Intrauterine route once.   Yes [provider]  lurasidone (LATUDA) 20 MG TABS tablet Take 20 mg by mouth at bedtime.   Yes [provider]  VYVANSE 70 MG capsule Take 70 mg by mouth daily. 07/10/17  Yes [provider]  clindamycin (CLEOCIN) 300 MG capsule Take 1 capsule (300 mg total) by mouth 4 (four) times daily. 07/12/17   Janne Napoleon, NP  lidocaine (XYLOCAINE) 2 % solution Use as directed 10 mLs in the mouth or throat every 4 (four) hours as needed (tonsillar pain). 07/12/17   Janne Napoleon, NP  oxyCODONE-acetaminophen (ROXICET) [phone] MG/5ML solution Take 5 mLs by mouth every 4 (four) hours as needed for severe pain. 07/12/17   Janne Napoleon, NP    Family History Family History  Problem Relation Age of Onset  . Crohn's disease Maternal Grandmother   . Cancer Paternal Grandfather        stomach    Social History Social History  Substance Use Topics  . Smoking status: Current Some Day Smoker  . Smokeless tobacco: Never Used     Comment: quit 04/2014,started back smoking 11/2014  . Alcohol use 0.0 oz/week  Comment: one time monthly     Allergies   Nsaids   Review of Systems Review of Systems  Constitutional: Negative for chills and fever.  HENT: Positive for congestion, ear pain, sore throat and trouble swallowing.   Eyes: Negative for pain, discharge, redness and itching.  Respiratory: Positive for cough and shortness of breath.   Cardiovascular: Negative for palpitations. Chest pain: with cough.  Gastrointestinal: Negative for abdominal pain, nausea and vomiting.  Genitourinary: Negative for dysuria, frequency, pelvic pain, vaginal bleeding and vaginal discharge.  Musculoskeletal: Negative for neck stiffness. Neck pain: due to gland swelling.  Skin: Negative for  rash.  Neurological: Positive for headaches. Negative for syncope.  Hematological: Positive for adenopathy.  Psychiatric/Behavioral: Negative for confusion.     Physical Exam Updated Vital Signs BP (!) 148/106 (BP Location: Right Arm)   Pulse (!) 105   Temp 98.1 F (36.7 C) (Oral)   Resp 18   Ht 5\' 8"  (1.727 m)   Wt 126.6 kg (279 lb)   SpO2 100%   BMI 42.42 kg/m   Physical Exam  Constitutional: She is oriented to person, place, and time. She appears well-developed and well-nourished. No distress.  HENT:  Head: Normocephalic.  Mouth/Throat: Uvula is midline. Posterior oropharyngeal edema, posterior oropharyngeal erythema and tonsillar abscesses (left) present. Tonsils are 3+ on the left. Tonsillar exudate.    Eyes: EOM are normal.  Neck: Neck supple.  Large anterior cervical nodes palpated bilateral.  Cardiovascular: Normal rate and regular rhythm.   Pulmonary/Chest: Effort normal and breath sounds normal. She has no wheezes.  Abdominal: Soft. Bowel sounds are normal. There is no tenderness.  Musculoskeletal: Normal range of motion.  Lymphadenopathy:    She has cervical adenopathy.  Neurological: She is alert and oriented to person, place, and time. No cranial nerve deficit.  Skin: Skin is warm and dry.  Psychiatric: She has a normal mood and affect. Her behavior is normal.  Nursing note and vitals reviewed.    ED Treatments / Results  Labs (all labs ordered are listed, but only abnormal results are displayed) Labs Reviewed  CBC WITH DIFFERENTIAL/PLATELET - Abnormal; Notable for the following:       Result Value   WBC 11.5 (*)    Platelets 419 (*)    Neutro Abs 8.5 (*)    Monocytes Absolute 1.1 (*)    All other components within normal limits  BASIC METABOLIC PANEL  MONONUCLEOSIS SCREEN   1:37 pm Consult with Dr. Suszanne Conners and he request patient have 10 mg Decadron in addition to the 10 mg she has already had and he agrees with Clindamycin 900 mg., IV hydration and  Rx Clindamycin 300 mg QID x 10 days and f/u in the office. Observe for 2 hours.  Radiology No results found.  Procedures Procedures (including critical care time)  Medications Ordered in ED Medications  dexamethasone (DECADRON) injection 10 mg (10 mg Intravenous Given 07/12/17 1330)  0.9 %  sodium chloride infusion ( Intravenous Stopped 07/12/17 1606)  HYDROmorphone (DILAUDID) injection 0.5 mg (0.5 mg Intravenous Given 07/12/17 1353)  ondansetron (ZOFRAN) injection 4 mg (4 mg Intravenous Given 07/12/17 1353)  clindamycin (CLEOCIN) IVPB 900 mg (0 mg Intravenous Stopped 07/12/17 1501)  dexamethasone (DECADRON) injection 10 mg (10 mg Intravenous Given 07/12/17 1430)  HYDROmorphone (DILAUDID) injection 0.5 mg (0.5 mg Intravenous Given 07/12/17 1444)   Re examine patient prior to d/c and exam has minimal change since her initial exam. There are no worsening symptoms.   Initial  Impression / Assessment and Plan / ED Course  I have reviewed the triage vital signs and the nursing notes. 36 y.o. female with peritonsillar abscess stable for d/c after observed for 2 hours s/p IV antibiotics, IV fluids, steroids and pain management. Patient taking PO fluids and does not appear to be toxic or have difficulty swallowing her secretions. Will d/c home with Rx for Clindamycin and pain medication. Patient to f/u with Dr. Suszanne Conners or return here sooner for worsening symptoms. Patient agrees with plan.  Final Clinical Impressions(s) / ED Diagnoses   Final diagnoses:  Peritonsillar abscess    New Prescriptions New Prescriptions   CLINDAMYCIN (CLEOCIN) 300 MG CAPSULE    Take 1 capsule (300 mg total) by mouth 4 (four) times daily.   LIDOCAINE (XYLOCAINE) 2 % SOLUTION    Use as directed 10 mLs in the mouth or throat every 4 (four) hours as needed (tonsillar pain).   OXYCODONE-ACETAMINOPHEN (ROXICET) 5-325 MG/5ML SOLUTION    Take 5 mLs by mouth every 4 (four) hours as needed for severe pain.     Kerrie Buffalo [place], [place] 07/12/17  1642    Doug Sou, MD 07/12/17 1730  NOTE: No pharmacy had the liquid pain medication. Patient states she thinks she can swallow the pill now so will give new Rx for pain.    Kerrie Buffalo [place], [place] 07/12/17 1749    Doug Sou, MD 07/13/17 [phone]

## 2017-07-12 NOTE — ED Triage Notes (Signed)
Patient is alert and oriented x4.  She is being seen for throat pain on the left side with swelling.  Patient states that last night she noticed some discomfort on the right side of her throat and that today it has worsened.  Currently she rates her pain 9 of 10.

## 2017-07-12 NOTE — ED Triage Notes (Signed)
Pt stated that she was seen at [facility] this am. Pt stated that she was told that she had an abscess in her throat. Stated that her strep was negative

## 2017-07-12 NOTE — Discharge Instructions (Signed)
Call Dr. Midge Minium office and tell them you were seen in the ED with a tonsillar abscess and need to see him for follow up. Do not drive while taking the pain medication as it will make you sleepy. Return here for worsening symptoms.

## 2017-07-12 NOTE — ED Provider Notes (Signed)
Complains of left-sided sore throat onset 4 days ago. Pain is worse with swallowing. On exam patient is alert and nontoxic. Handling secretions well. Oral pharynx reddened. Left tonsil is large with exudate. Uvula midline. No hot potato voice   Doug Sou, MD 07/12/17 1331

## 2017-08-24 NOTE — Telephone Encounter (Signed)
Close Encounter 

## 2017-10-08 ENCOUNTER — Ambulatory Visit: Payer: Self-pay | Admitting: Plastic Surgery

## 2017-10-10 DIAGNOSIS — N62 Hypertrophy of breast: Secondary | ICD-10-CM

## 2017-10-10 HISTORY — DX: Hypertrophy of breast: N62

## 2017-10-16 ENCOUNTER — Other Ambulatory Visit (HOSPITAL_BASED_OUTPATIENT_CLINIC_OR_DEPARTMENT_OTHER): Payer: Self-pay

## 2017-10-16 DIAGNOSIS — G4730 Sleep apnea, unspecified: Secondary | ICD-10-CM

## 2017-10-22 ENCOUNTER — Other Ambulatory Visit: Payer: Self-pay

## 2017-10-22 ENCOUNTER — Encounter (HOSPITAL_BASED_OUTPATIENT_CLINIC_OR_DEPARTMENT_OTHER): Payer: Self-pay | Admitting: *Deleted

## 2017-10-22 DIAGNOSIS — R059 Cough, unspecified: Secondary | ICD-10-CM

## 2017-10-22 DIAGNOSIS — R0981 Nasal congestion: Secondary | ICD-10-CM

## 2017-10-22 HISTORY — DX: Nasal congestion: R09.81

## 2017-10-22 HISTORY — DX: Cough, unspecified: R05.9

## 2017-10-23 ENCOUNTER — Ambulatory Visit (HOSPITAL_BASED_OUTPATIENT_CLINIC_OR_DEPARTMENT_OTHER): Payer: BC Managed Care – PPO | Attending: Otolaryngology | Admitting: Internal Medicine

## 2017-10-23 VITALS — Ht 68.0 in | Wt 271.0 lb

## 2017-10-23 DIAGNOSIS — Z79899 Other long term (current) drug therapy: Secondary | ICD-10-CM | POA: Diagnosis not present

## 2017-10-23 DIAGNOSIS — R0683 Snoring: Secondary | ICD-10-CM | POA: Diagnosis not present

## 2017-10-23 DIAGNOSIS — G4733 Obstructive sleep apnea (adult) (pediatric): Secondary | ICD-10-CM | POA: Insufficient documentation

## 2017-10-23 DIAGNOSIS — I493 Ventricular premature depolarization: Secondary | ICD-10-CM | POA: Diagnosis not present

## 2017-10-23 DIAGNOSIS — G4730 Sleep apnea, unspecified: Secondary | ICD-10-CM

## 2017-10-27 ENCOUNTER — Ambulatory Visit (HOSPITAL_BASED_OUTPATIENT_CLINIC_OR_DEPARTMENT_OTHER)
Admission: RE | Admit: 2017-10-27 | Discharge: 2017-10-27 | Disposition: A | Discharge status: Home / Self Care | Payer: BC Managed Care – PPO | Source: Ambulatory Visit | Attending: Plastic Surgery | Admitting: Plastic Surgery

## 2017-10-27 ENCOUNTER — Ambulatory Visit (HOSPITAL_BASED_OUTPATIENT_CLINIC_OR_DEPARTMENT_OTHER): Payer: BC Managed Care – PPO | Admitting: Certified Registered"

## 2017-10-27 ENCOUNTER — Encounter (HOSPITAL_BASED_OUTPATIENT_CLINIC_OR_DEPARTMENT_OTHER)
Admission: RE | Disposition: A | Discharge status: Home / Self Care | Payer: Self-pay | Source: Ambulatory Visit | Attending: Plastic Surgery

## 2017-10-27 ENCOUNTER — Other Ambulatory Visit: Payer: Self-pay

## 2017-10-27 ENCOUNTER — Encounter (HOSPITAL_BASED_OUTPATIENT_CLINIC_OR_DEPARTMENT_OTHER): Payer: Self-pay

## 2017-10-27 DIAGNOSIS — Z886 Allergy status to analgesic agent status: Secondary | ICD-10-CM | POA: Insufficient documentation

## 2017-10-27 DIAGNOSIS — K219 Gastro-esophageal reflux disease without esophagitis: Secondary | ICD-10-CM | POA: Insufficient documentation

## 2017-10-27 DIAGNOSIS — N62 Hypertrophy of breast: Secondary | ICD-10-CM | POA: Insufficient documentation

## 2017-10-27 DIAGNOSIS — N6011 Diffuse cystic mastopathy of right breast: Secondary | ICD-10-CM | POA: Insufficient documentation

## 2017-10-27 DIAGNOSIS — N6012 Diffuse cystic mastopathy of left breast: Secondary | ICD-10-CM | POA: Insufficient documentation

## 2017-10-27 DIAGNOSIS — G4730 Sleep apnea, unspecified: Secondary | ICD-10-CM | POA: Insufficient documentation

## 2017-10-27 DIAGNOSIS — Z6841 Body Mass Index (BMI) 40.0 and over, adult: Secondary | ICD-10-CM | POA: Diagnosis not present

## 2017-10-27 DIAGNOSIS — F17200 Nicotine dependence, unspecified, uncomplicated: Secondary | ICD-10-CM | POA: Insufficient documentation

## 2017-10-27 DIAGNOSIS — F419 Anxiety disorder, unspecified: Secondary | ICD-10-CM | POA: Insufficient documentation

## 2017-10-27 DIAGNOSIS — E669 Obesity, unspecified: Secondary | ICD-10-CM | POA: Diagnosis not present

## 2017-10-27 DIAGNOSIS — F429 Obsessive-compulsive disorder, unspecified: Secondary | ICD-10-CM | POA: Insufficient documentation

## 2017-10-27 DIAGNOSIS — F329 Major depressive disorder, single episode, unspecified: Secondary | ICD-10-CM | POA: Diagnosis not present

## 2017-10-27 DIAGNOSIS — Z79899 Other long term (current) drug therapy: Secondary | ICD-10-CM | POA: Insufficient documentation

## 2017-10-27 DIAGNOSIS — G4700 Insomnia, unspecified: Secondary | ICD-10-CM | POA: Diagnosis not present

## 2017-10-27 DIAGNOSIS — K5090 Crohn's disease, unspecified, without complications: Secondary | ICD-10-CM | POA: Insufficient documentation

## 2017-10-27 HISTORY — DX: Sleep apnea, unspecified: G47.30

## 2017-10-27 HISTORY — PX: BREAST REDUCTION SURGERY: SHX8

## 2017-10-27 HISTORY — DX: Other complications of anesthesia, initial encounter: T88.59XA

## 2017-10-27 HISTORY — DX: Gastro-esophageal reflux disease without esophagitis: K21.9

## 2017-10-27 HISTORY — DX: Nasal congestion: R09.81

## 2017-10-27 HISTORY — DX: Adverse effect of unspecified anesthetic, initial encounter: T41.45XA

## 2017-10-27 HISTORY — DX: Hypertrophy of breast: N62

## 2017-10-27 HISTORY — DX: Cough: R05

## 2017-10-27 SURGERY — MAMMOPLASTY, REDUCTION
Anesthesia: General | Site: Breast | Laterality: Bilateral

## 2017-10-27 MED ORDER — BUPIVACAINE LIPOSOME 1.3 % IJ SUSP
INTRAMUSCULAR | Status: AC
  Filled 2017-10-27: qty 20

## 2017-10-27 MED ORDER — CHLORHEXIDINE GLUCONATE CLOTH 2 % EX PADS: 6.0000 | MEDICATED_PAD | Freq: Once | CUTANEOUS | Status: DC

## 2017-10-27 MED ORDER — ROCURONIUM BROMIDE 10 MG/ML (PF) SYRINGE
PREFILLED_SYRINGE | INTRAVENOUS | Status: AC
  Filled 2017-10-27: qty 5

## 2017-10-27 MED ORDER — DEXMEDETOMIDINE HCL IN NACL 200 MCG/50ML IV SOLN
INTRAVENOUS | Status: DC | PRN
  Administered 2017-10-27 (×5): 4 ug via INTRAVENOUS

## 2017-10-27 MED ORDER — ONDANSETRON HCL 4 MG/2ML IJ SOLN
INTRAMUSCULAR | Status: AC
  Filled 2017-10-27: qty 4

## 2017-10-27 MED ORDER — LIDOCAINE HCL (CARDIAC) 20 MG/ML IV SOLN
INTRAVENOUS | Status: DC | PRN
  Administered 2017-10-27: 60 mg via INTRAVENOUS

## 2017-10-27 MED ORDER — LACTATED RINGERS IV SOLN
INTRAVENOUS | Status: DC
  Administered 2017-10-27 (×3): via INTRAVENOUS

## 2017-10-27 MED ORDER — PROPOFOL 500 MG/50ML IV EMUL
INTRAVENOUS | Status: AC
  Filled 2017-10-27: qty 50

## 2017-10-27 MED ORDER — FENTANYL CITRATE (PF) 100 MCG/2ML IJ SOLN
INTRAMUSCULAR | Status: AC
  Filled 2017-10-27: qty 2

## 2017-10-27 MED ORDER — LIDOCAINE-EPINEPHRINE 1 %-1:100000 IJ SOLN
INTRAMUSCULAR | Status: AC
  Filled 2017-10-27: qty 1

## 2017-10-27 MED ORDER — FENTANYL CITRATE (PF) 100 MCG/2ML IJ SOLN
50.0000 ug | INTRAMUSCULAR | Status: AC | PRN
  Administered 2017-10-27 (×3): 50 ug via INTRAVENOUS
  Administered 2017-10-27: 100 ug via INTRAVENOUS
  Administered 2017-10-27 (×2): 50 ug via INTRAVENOUS
  Administered 2017-10-27: 100 ug via INTRAVENOUS
  Administered 2017-10-27: 50 ug via INTRAVENOUS

## 2017-10-27 MED ORDER — CEFAZOLIN SODIUM-DEXTROSE 2-4 GM/100ML-% IV SOLN
2.0000 g | INTRAVENOUS | Status: AC
  Administered 2017-10-27: 2 g via INTRAVENOUS

## 2017-10-27 MED ORDER — SUGAMMADEX SODIUM 500 MG/5ML IV SOLN
INTRAVENOUS | Status: AC
  Filled 2017-10-27: qty 5

## 2017-10-27 MED ORDER — LIDOCAINE-EPINEPHRINE 1 %-1:100000 IJ SOLN
INTRAMUSCULAR | Status: DC | PRN
Start: 2017-10-27 — End: 2017-10-27
  Administered 2017-10-27: 30 mL

## 2017-10-27 MED ORDER — SCOPOLAMINE 1 MG/3DAYS TD PT72: 1.0000 | MEDICATED_PATCH | Freq: Once | TRANSDERMAL | Status: DC | PRN

## 2017-10-27 MED ORDER — PHENYLEPHRINE 40 MCG/ML (10ML) SYRINGE FOR IV PUSH (FOR BLOOD PRESSURE SUPPORT)
PREFILLED_SYRINGE | INTRAVENOUS | Status: AC
  Filled 2017-10-27: qty 10

## 2017-10-27 MED ORDER — ROCURONIUM BROMIDE 100 MG/10ML IV SOLN
INTRAVENOUS | Status: DC | PRN
  Administered 2017-10-27: 60 mg via INTRAVENOUS

## 2017-10-27 MED ORDER — BUPIVACAINE LIPOSOME 1.3 % IJ SUSP
INTRAMUSCULAR | Status: DC | PRN
  Administered 2017-10-27: 20 mL

## 2017-10-27 MED ORDER — BACITRACIN ZINC 500 UNIT/GM EX OINT
TOPICAL_OINTMENT | CUTANEOUS | Status: DC | PRN
Start: 2017-10-27 — End: 2017-10-27
  Administered 2017-10-27: 1 via TOPICAL

## 2017-10-27 MED ORDER — PHENYLEPHRINE HCL 10 MG/ML IJ SOLN
INTRAMUSCULAR | Status: DC | PRN
  Administered 2017-10-27 (×2): 80 ug via INTRAVENOUS

## 2017-10-27 MED ORDER — MEPERIDINE HCL 25 MG/ML IJ SOLN: 6.2500 mg | INTRAMUSCULAR | Status: DC | PRN

## 2017-10-27 MED ORDER — HYDROMORPHONE HCL 1 MG/ML IJ SOLN
0.2500 mg | INTRAMUSCULAR | Status: DC | PRN
  Administered 2017-10-27: 0.5 mg via INTRAVENOUS

## 2017-10-27 MED ORDER — DEXAMETHASONE SODIUM PHOSPHATE 10 MG/ML IJ SOLN
INTRAMUSCULAR | Status: AC
  Filled 2017-10-27: qty 1

## 2017-10-27 MED ORDER — DEXAMETHASONE SODIUM PHOSPHATE 4 MG/ML IJ SOLN
INTRAMUSCULAR | Status: DC | PRN
  Administered 2017-10-27: 10 mg via INTRAVENOUS

## 2017-10-27 MED ORDER — PROMETHAZINE HCL 25 MG/ML IJ SOLN
6.2500 mg | INTRAMUSCULAR | Status: DC | PRN
  Administered 2017-10-27: 6.25 mg via INTRAVENOUS

## 2017-10-27 MED ORDER — LACTATED RINGERS IV SOLN: INTRAVENOUS | Status: DC

## 2017-10-27 MED ORDER — SODIUM CHLORIDE 0.9 % IJ SOLN
INTRAMUSCULAR | Status: AC
  Filled 2017-10-27: qty 20

## 2017-10-27 MED ORDER — MIDAZOLAM HCL 2 MG/2ML IJ SOLN
INTRAMUSCULAR | Status: AC
  Filled 2017-10-27: qty 2

## 2017-10-27 MED ORDER — SCOPOLAMINE 1 MG/3DAYS TD PT72
MEDICATED_PATCH | TRANSDERMAL | Status: AC
  Filled 2017-10-27: qty 1

## 2017-10-27 MED ORDER — BUPIVACAINE HCL (PF) 0.5 % IJ SOLN
INTRAMUSCULAR | Status: AC
  Filled 2017-10-27: qty 60

## 2017-10-27 MED ORDER — SODIUM CHLORIDE 0.9 % IJ SOLN
INTRAMUSCULAR | Status: AC
  Filled 2017-10-27: qty 10

## 2017-10-27 MED ORDER — PROMETHAZINE HCL 25 MG/ML IJ SOLN
INTRAMUSCULAR | Status: AC
  Filled 2017-10-27: qty 1

## 2017-10-27 MED ORDER — MIDAZOLAM HCL 2 MG/2ML IJ SOLN
1.0000 mg | INTRAMUSCULAR | Status: DC | PRN
  Administered 2017-10-27: 2 mg via INTRAVENOUS

## 2017-10-27 MED ORDER — OXYCODONE HCL 5 MG PO TABS: 5.0000 mg | ORAL_TABLET | Freq: Once | ORAL | Status: DC | PRN

## 2017-10-27 MED ORDER — SODIUM CHLORIDE 0.9 % IJ SOLN
INTRAMUSCULAR | Status: DC | PRN
  Administered 2017-10-27: 20 mL via INTRAVENOUS

## 2017-10-27 MED ORDER — BUPIVACAINE HCL (PF) 0.5 % IJ SOLN
INTRAMUSCULAR | Status: AC
  Filled 2017-10-27: qty 30

## 2017-10-27 MED ORDER — CEFAZOLIN SODIUM-DEXTROSE 2-4 GM/100ML-% IV SOLN
INTRAVENOUS | Status: AC
  Filled 2017-10-27: qty 100

## 2017-10-27 MED ORDER — ONDANSETRON HCL 4 MG/2ML IJ SOLN
INTRAMUSCULAR | Status: DC | PRN
  Administered 2017-10-27: 4 mg via INTRAVENOUS

## 2017-10-27 MED ORDER — SUGAMMADEX SODIUM 200 MG/2ML IV SOLN
INTRAVENOUS | Status: DC | PRN
  Administered 2017-10-27: 200 mg via INTRAVENOUS

## 2017-10-27 MED ORDER — OXYCODONE HCL 5 MG/5ML PO SOLN: 5.0000 mg | Freq: Once | ORAL | Status: DC | PRN

## 2017-10-27 MED ORDER — HYDROMORPHONE HCL 1 MG/ML IJ SOLN
INTRAMUSCULAR | Status: AC
  Filled 2017-10-27: qty 0.5

## 2017-10-27 SURGICAL SUPPLY — 67 items
APL SKNCLS STERI-STRIP NONHPOA (GAUZE/BANDAGES/DRESSINGS) ×2
BAG DECANTER FOR FLEXI CONT (MISCELLANEOUS) ×3 IMPLANT
BENZOIN TINCTURE PRP APPL 2/3 (GAUZE/BANDAGES/DRESSINGS) ×6 IMPLANT
BLADE KNIFE PERSONA 10 (BLADE) ×12 IMPLANT
BLADE KNIFE PERSONA 15 (BLADE) ×9 IMPLANT
BNDG GAUZE ELAST 4 BULKY (GAUZE/BANDAGES/DRESSINGS) ×6 IMPLANT
CANISTER SUCT 1200ML W/VALVE (MISCELLANEOUS) ×3 IMPLANT
CAP BOUFFANT 24 BLUE NURSES (PROTECTIVE WEAR) ×3 IMPLANT
CLOSURE STERI-STRIP 1/2X4 (GAUZE/BANDAGES/DRESSINGS) ×2
CLOSURE WOUND 1/2 X4 (GAUZE/BANDAGES/DRESSINGS) ×4
CLSR STERI-STRIP ANTIMIC 1/2X4 (GAUZE/BANDAGES/DRESSINGS) ×2 IMPLANT
COVER BACK TABLE 60X90IN (DRAPES) ×3 IMPLANT
COVER MAYO STAND STRL (DRAPES) ×3 IMPLANT
DECANTER SPIKE VIAL GLASS SM (MISCELLANEOUS) ×6 IMPLANT
DRAIN CHANNEL 10F 3/8 F FF (DRAIN) ×6 IMPLANT
DRAPE LAPAROSCOPIC ABDOMINAL (DRAPES) ×3 IMPLANT
DRAPE U-SHAPE 76X120 STRL (DRAPES) ×2 IMPLANT
DRSG EMULSION OIL 3X3 NADH (GAUZE/BANDAGES/DRESSINGS) ×6 IMPLANT
DRSG PAD ABDOMINAL 8X10 ST (GAUZE/BANDAGES/DRESSINGS) ×6 IMPLANT
ELECT REM PT RETURN 9FT ADLT (ELECTROSURGICAL) ×3
ELECTRODE REM PT RTRN 9FT ADLT (ELECTROSURGICAL) ×1 IMPLANT
EVACUATOR SILICONE 100CC (DRAIN) ×6 IMPLANT
FILTER 7/8 IN (FILTER) ×2 IMPLANT
GAUZE SPONGE 4X4 12PLY STRL (GAUZE/BANDAGES/DRESSINGS) ×6 IMPLANT
GLOVE BIO SURGEON STRL SZ7 (GLOVE) ×3 IMPLANT
GLOVE BIOGEL PI IND STRL 7.0 (GLOVE) ×1 IMPLANT
GLOVE BIOGEL PI INDICATOR 7.0 (GLOVE) ×2
GLOVE SURG SS PI 6.5 STRL IVOR (GLOVE) ×2 IMPLANT
GOWN STRL REUS W/ TWL LRG LVL3 (GOWN DISPOSABLE) ×2 IMPLANT
GOWN STRL REUS W/TWL LRG LVL3 (GOWN DISPOSABLE) ×6
IV NS 250ML (IV SOLUTION) ×3
IV NS 250ML BAXH (IV SOLUTION) ×1 IMPLANT
NDL HYPO 25X1 1.5 SAFETY (NEEDLE) ×3 IMPLANT
NDL SAFETY ECLIPSE 18X1.5 (NEEDLE) ×1 IMPLANT
NEEDLE HYPO 18GX1.5 SHARP (NEEDLE) ×3
NEEDLE HYPO 25X1 1.5 SAFETY (NEEDLE) ×9 IMPLANT
NEEDLE SPNL 18GX3.5 QUINCKE PK (NEEDLE) ×3 IMPLANT
NS IRRIG 1000ML POUR BTL (IV SOLUTION) ×6 IMPLANT
PACK BASIN DAY SURGERY FS (CUSTOM PROCEDURE TRAY) ×3 IMPLANT
PIN SAFETY STERILE (MISCELLANEOUS) ×3 IMPLANT
SCRUB TECHNI CARE 4 OZ NO DYE (MISCELLANEOUS) ×3 IMPLANT
SLEEVE SCD COMPRESS KNEE MED (MISCELLANEOUS) ×3 IMPLANT
SPECIMEN JAR MEDIUM (MISCELLANEOUS) ×6 IMPLANT
SPECIMEN JAR X LARGE (MISCELLANEOUS) IMPLANT
SPONGE LAP 18X18 X RAY DECT (DISPOSABLE) ×11 IMPLANT
STAPLER VISISTAT 35W (STAPLE) ×6 IMPLANT
STRIP CLOSURE SKIN 1/2X4 (GAUZE/BANDAGES/DRESSINGS) ×8 IMPLANT
SUT ETHILON 3 0 PS 1 (SUTURE) ×3 IMPLANT
SUT MNCRL AB 3-0 PS2 18 (SUTURE) ×4 IMPLANT
SUT MNCRL AB 4-0 PS2 18 (SUTURE) ×8 IMPLANT
SUT MON AB 5-0 PS2 18 (SUTURE) ×8 IMPLANT
SUT PROLENE 2 0 CT2 30 (SUTURE) ×3 IMPLANT
SUT PROLENE 3 0 PS 1 (SUTURE) ×6 IMPLANT
SUT VLOC 90 P-14 23 (SUTURE) ×6 IMPLANT
SYR BULB IRRIGATION 50ML (SYRINGE) ×6 IMPLANT
SYR CONTROL 10ML LL (SYRINGE) ×6 IMPLANT
TAPE MEASURE VINYL STERILE (MISCELLANEOUS) ×3 IMPLANT
TOWEL OR 17X24 6PK STRL BLUE (TOWEL DISPOSABLE) ×9 IMPLANT
TOWEL OR NON WOVEN STRL DISP B (DISPOSABLE) IMPLANT
TRAY DSU PREP LF (CUSTOM PROCEDURE TRAY) ×3 IMPLANT
TRAY FOLEY BAG SILVER LF 14FR (SET/KITS/TRAYS/PACK) ×3 IMPLANT
TRAY FOLEY BAG SILVER LF 16FR (SET/KITS/TRAYS/PACK) IMPLANT
TUBE CONNECTING 20'X1/4 (TUBING) ×1
TUBE CONNECTING 20X1/4 (TUBING) ×2 IMPLANT
UNDERPAD 30X30 (UNDERPADS AND DIAPERS) ×6 IMPLANT
VAC PENCILS W/TUBING CLEAR (MISCELLANEOUS) ×3 IMPLANT
YANKAUER SUCT BULB TIP NO VENT (SUCTIONS) ×3 IMPLANT

## 2017-10-27 NOTE — Addendum Note (Signed)
Addendum  created 10/27/17 1512 by Kilee Hedding, Jewel Baizeimothy D, CRNA   Intraprocedure Event edited, Intraprocedure Flowsheets edited

## 2017-10-27 NOTE — Anesthesia Postprocedure Evaluation (Signed)
Anesthesia Post Note  Patient: Pamela [name]  Procedure(s) Performed: BILATERAL MAMMARY REDUCTION  (BREAST) (Bilateral Breast)     Patient location during evaluation: PACU Anesthesia Type: General Level of consciousness: awake and alert Pain management: pain level controlled Vital Signs Assessment: post-procedure vital signs reviewed and stable Respiratory status: spontaneous breathing, nonlabored ventilation, respiratory function stable and patient connected to nasal cannula oxygen Cardiovascular status: blood pressure returned to baseline and stable Postop Assessment: no apparent nausea or vomiting Anesthetic complications: no    Last Vitals:  Vitals:   10/27/17 1245 10/27/17 1300  BP: 109/63 109/63  Pulse: 88 93  Resp: (!) 21 18  Temp:    SpO2: 96% 97%    Last Pain:  Vitals:   10/27/17 1300  TempSrc:   PainSc: 2                  Shelton Silvas

## 2017-10-27 NOTE — H&P (Signed)
  H&P faxed to surgical center.  -History and Physical Reviewed  -Patient has been re-examined  -No change in the plan of care  CONTOGIANNIS,MARY A    

## 2017-10-27 NOTE — Anesthesia Preprocedure Evaluation (Addendum)
Anesthesia Evaluation  Patient identified by MRN, date of birth, ID band Patient awake    Reviewed: Allergy & Precautions, NPO status , Patient's Chart, lab work & pertinent test results  Airway Mallampati: I  TM Distance: >3 FB Neck ROM: Full    Dental  (+) Teeth Intact, Dental Advisory Given,    Pulmonary sleep apnea , Current Smoker,    Pulmonary exam normal        Cardiovascular negative cardio ROS   Rhythm:Regular Rate:Normal     Neuro/Psych PSYCHIATRIC DISORDERS Anxiety Depression    GI/Hepatic Neg liver ROS, GERD  ,  Endo/Other  negative endocrine ROS  Renal/GU negative Renal ROS  negative genitourinary   Musculoskeletal negative musculoskeletal ROS (+)   Abdominal (+) + obese,   Peds  Hematology negative hematology ROS (+)   Anesthesia Other Findings - Crohn's Disease  Reproductive/Obstetrics negative OB ROS                           Anesthesia Physical Anesthesia Plan  ASA: III  Anesthesia Plan: General   Post-op Pain Management:    Induction: Intravenous  PONV Risk Score and Plan: 3 and Ondansetron, Dexamethasone, Scopolamine patch - Pre-op and Midazolam  Airway Management Planned: Oral ETT  Additional Equipment:   Intra-op Plan:   Post-operative Plan: Extubation in OR  Informed Consent: I have reviewed the patients History and Physical, chart, labs and discussed the procedure including the risks, benefits and alternatives for the proposed anesthesia with the patient or authorized representative who has indicated his/her understanding and acceptance.   Dental advisory given  Plan Discussed with: CRNA  Anesthesia Plan Comments:         Anesthesia Quick Evaluation

## 2017-10-27 NOTE — Discharge Instructions (Signed)
Post Anesthesia Home Care Instructions  Activity: Get plenty of rest for the remainder of the day. A responsible individual must stay with you for 24 hours following the procedure.  For the next 24 hours, DO NOT: -Drive a car -Advertising copywriter -Drink alcoholic beverages -Take any medication unless instructed by your physician -Make any legal decisions or sign important papers.  Meals: Start with liquid foods such as gelatin or soup. Progress to regular foods as tolerated. Avoid greasy, spicy, heavy foods. If nausea and/or vomiting occur, drink only clear liquids until the nausea and/or vomiting subsides. Call your physician if vomiting continues.  Special Instructions/Symptoms: Your throat may feel dry or sore from the anesthesia or the breathing tube placed in your throat during surgery. If this causes discomfort, gargle with warm salt water. The discomfort should disappear within 24 hours.  If you had a scopolamine patch placed behind your ear for the management of post- operative nausea and/or vomiting:  1. The medication in the patch is effective for 72 hours, after which it should be removed.  Wrap patch in a tissue and discard in the trash. Wash hands thoroughly with soap and water. 2. You may remove the patch earlier than 72 hours if you experience unpleasant side effects which may include dry mouth, dizziness or visual disturbances. 3. Avoid touching the patch. Wash your hands with soap and water after contact with the patch.      Information for Discharge Teaching: EXPAREL (bupivacaine liposome injectable suspension)   Your surgeon gave you EXPAREL(bupivacaine) in your surgical incision to help control your pain after surgery.   EXPAREL is a local anesthetic that provides pain relief by numbing the tissue around the surgical site.  EXPAREL is designed to release pain medication over time and can control pain for up to 72 hours.  Depending on how you respond to EXPAREL,  you may require less pain medication during your recovery.  Possible side effects:  Temporary loss of sensation or ability to move in the area where bupivacaine was injected.  Nausea, vomiting, constipation  Rarely, numbness and tingling in your mouth or lips, lightheadedness, or anxiety may occur.  Call your doctor right away if you think you may be experiencing any of these sensations, or if you have other questions regarding possible side effects.  Follow all other discharge instructions given to you by your surgeon or nurse. Eat a healthy diet and drink plenty of water or other fluids.  If you return to the hospital for any reason within 96 hours following the administration of EXPAREL, please inform your health care providers.       JP Drain [employer] this sheet to all of your post-operative appointments while you have your drains.  Please measure your drains by CC's or ML's.  Make sure you drain and measure your JP Drains 2 or 3 times per day.  At the end of each day, add up totals for the left side and add up totals for the right side.    ( 9 am )     ( 3 pm )        ( 9 pm )                Date L  R  L  R  L  R  Total L/R  1. No lifting greater than 5 lbs with arms for 4 weeks. 2. Empty, strip, record and reactivate JP drains 3 times a day. 3. Percocet 5/325 mg tabs 1-2 tabs po q 4-6 hours prn pain- prescription given in office. 4. Duricef 1 tab po bid- prescription given in office. 5. Sterapred dose pack as directed- prescription given in office. 6. Follow-up appointment in a few days in office.

## 2017-10-27 NOTE — Brief Op Note (Signed)
10/27/2017  11:57 AM  PATIENT:  Pamela [name]  36 y.o. female  PRE-OPERATIVE DIAGNOSIS:  BILATERAL MACROMASTIA  POST-OPERATIVE DIAGNOSIS:  BILATERAL MACROMASTIA  PROCEDURE:  Procedure(s): BILATERAL MAMMARY REDUCTION  (BREAST) (Bilateral)  SURGEON:  Surgeon(s) and Role:    * Contogiannis, Chales Abrahams, MD - Primary  ANESTHESIA:   general  EBL:  150 mL   BLOOD ADMINISTERED:none  DRAINS: (77F) Jackson-Pratt drain(s) with closed bulb suction in the Bilateral Breasts   LOCAL MEDICATIONS USED:  1.3% Exparel (total 266 mgs.)  SPECIMEN:  Source of Specimen:  Bilateral Breasts  DISPOSITION OF SPECIMEN:  PATHOLOGY  COUNTS:  YES  DICTATION: .Note written in EPIC  PLAN OF CARE: Discharge to home after PACU  PATIENT DISPOSITION:  PACU - hemodynamically stable.   Delay start of Pharmacological VTE agent (>24hrs) due to surgical blood loss or risk of bleeding: not applicable

## 2017-10-27 NOTE — Transfer of Care (Signed)
Immediate Anesthesia Transfer of Care Note  Patient: Pamela [name]  Procedure(s) Performed: BILATERAL MAMMARY REDUCTION  (BREAST) (Bilateral Breast)  Patient Location: PACU  Anesthesia Type:General  Level of Consciousness: awake and patient cooperative  Airway & Oxygen Therapy: Patient Spontanous Breathing and Patient connected to face mask oxygen  Post-op Assessment: Report given to RN and Post -op Vital signs reviewed and stable  Post vital signs: Reviewed and stable  Last Vitals:  Vitals:   10/27/17 0651 10/27/17 1216  BP: 126/80   Pulse: (!) 113 85  Resp: 20 15  Temp: 36.9 C   SpO2: 99% 96%    Last Pain:  Vitals:   10/27/17 0651  TempSrc: Oral  PainSc:          Complications: No apparent anesthesia complications

## 2017-10-27 NOTE — Op Note (Signed)
OPERATIVE REPORT  10/27/2017  Galen Manila  PREOPERATIVE DIAGNOSIS:  Bilateral macromastia.  POSTOPERATIVE DIAGNOSIS:  Bilateral macromastia.  PROCEDURE:  Bilateral reduction mammoplasties.  ATTENDING SURGEON:  Eloise Levels, MD  ANESTHESIA:  General.  ANESTHESIOLOGIST:  , MD  COMPLICATIONS:  None.  INDICATIONS FOR THE PROCEDURE:  The patient is a 36 y.o. female who has bilateral macromastia that is clinically symptomatic.  She presents to undergo bilateral reduction mammoplasties.  DESCRIPTION OF PROCEDURE:  The patient was marked in preop holding area in a pattern of Wise for the future bilateral reduction mammoplasties. She was then taken back to the OR, placed on the table in supine position.  After adequate general anesthesia was obtained, the patient's chest was prepped with Techni-Care and draped in sterile fashion.  The bases of the breasts have been infiltrated with 1% lidocaine with epinephrine.  After adequate hemostasis and anesthesia taken effect, the procedure was begun.  Both of the breast reductions were performed in the following similar manner.  The nipple-areolar complex was marked with a 45-mm nipple marker.  The skin was then incised and deepithelialized around the nipple-areolar complex down to the inframammary crease in the inferior pedicle pattern.  Next, the medial, superior, and lateral skin flaps were elevated down to the chest wall.  Excess fat and glandular tissue removed from the inferior pedicle.  The nipple-areolar complex was examined and found to be pink and viable.  The wound was irrigated with saline irrigation.  Meticulous hemostasis was obtained with the Bovie electrocautery.  Inferior pedicle was centralized using 3-0 Prolene suture.  A #10 JP flat fully fluted drain was placed into the wound. The skin flaps were brought together at the inverted T junction with a 2- 0 Prolene suture.  The incisions were stapled for  temporary closure. The breasts compared and found to have good shape and symmetry.  The incisions were then closed from the medial aspect of the JP drain to the medial aspect of the [facility] incision by first placing a few 3-0 Monocryl sutures to tack together the dermal layer, and then both the dermal and cuticular layer were closed in a single layer using a 2-0 Quill PDO barbed suture.  Lateral to the JP drain incision was closed using 3-0 Monocryl in the dermal layer, followed by 3-0 Monocryl running intracuticular stitch on the skin.  The vertical limb of the Wise pattern was closed in the dermal layer using 3-0 Monocryl suture.  The patient was placed in the upright position.  The future location of the nipple-areolar complexes was marked on both breast mounds using the 45-mm nipple marker.  She was then placed back in the recumbent position.  Both of the nipple areolar complexes were brought out onto the breast mounds in the following similar manner.  The skin was incised as marked and removed in full thickness into the subcutaneous tissues.  The nipple- areolar complex was examined, found to be pink and viable, then brought out through this aperture and sewn in place using 4-0 Monocryl in the dermal layer, followed by 5-0 Monocryl running intracuticular stitch on the skin.  This 5-0 Monocryl suture was then brought down to close the cuticular layer of the vertical limb as well.  The JP drain was sewn in place using 3-0 nylon suture.  The pectoralis major muscle and fascia along with the breast and chest soft tissues were then infiltrated with 1% Exparel (total 266 mg).  Now the [facility] incision was also infiltrated  with the Exparel in order to give the patient postoperative pain control.  The incisions were dressed with benzoin, Steri-Strips, and the nipples dressed with bacitracin ointment and Adaptic.  4x4s were placed over the incisions and ABD pads in the axillary areas.  The patient  was placed into a light postoperative support bra.  There were no complications. The patient tolerated the procedure well.  The final needle, sponge counts were reported to be correct at the end of the case.  The patient was then recovered without complications.  Both the patient and her family were given proper postoperative wound care instructions. She was then discharged home in the care of her family in stable condition.  Follow up will be with me in a few days in the office.         Eloise LevelsMary Ann Contogiannis, M.D.  10/27/2017 12:03 PM

## 2017-10-27 NOTE — Anesthesia Procedure Notes (Signed)
Procedure Name: Intubation Date/Time: 10/27/2017 7:51 AM Performed by: Signe Colt, CRNA Pre-anesthesia Checklist: Patient identified, Emergency Drugs available, Suction available and Patient being monitored Patient Re-evaluated:Patient Re-evaluated prior to induction Oxygen Delivery Method: Circle system utilized Preoxygenation: Pre-oxygenation with 100% oxygen Induction Type: IV induction Ventilation: Mask ventilation without difficulty Laryngoscope Size: Mac and 3 Grade View: Grade I Tube type: Oral Tube size: 7.0 mm Number of attempts: 1 Airway Equipment and Method: Stylet and Oral airway Placement Confirmation: ETT inserted through vocal cords under direct vision,  positive ETCO2 and breath sounds checked- equal and bilateral Secured at: 21 cm Tube secured with: Tape Dental Injury: Teeth and Oropharynx as per pre-operative assessment

## 2017-10-28 ENCOUNTER — Encounter (HOSPITAL_BASED_OUTPATIENT_CLINIC_OR_DEPARTMENT_OTHER): Payer: Self-pay | Admitting: Plastic Surgery

## 2017-11-07 DIAGNOSIS — G4730 Sleep apnea, unspecified: Secondary | ICD-10-CM

## 2017-11-13 NOTE — Procedures (Signed)
   Patient Name: Pamela [name], Pamela [name] Date: 10/23/2017 Gender: Female D.O.B: February 08, 1981 Age (years): 36 Referring Provider: Newman Pies Height (inches): 68 Interpreting Physician: Jetty Duhamel MD, ABSM Weight (lbs): 271 RPSGT: Armen Pickup BMI: 41 MRN: [id number] Neck Size: 16.00 <br> <br> CLINICAL INFORMATION Sleep Study Type: NPSG  Indication for sleep study: OSA  Epworth Sleepiness Score: 3  SLEEP STUDY TECHNIQUE As per the AASM Manual for the Scoring of Sleep and Associated Events v2.3 (April 2016) with a hypopnea requiring 4% desaturations.  The channels recorded and monitored were frontal, central and occipital EEG, electrooculogram (EOG), submentalis EMG (chin), nasal and oral airflow, thoracic and abdominal wall motion, anterior tibialis EMG, snore microphone, electrocardiogram, and pulse oximetry.  MEDICATIONS Medications self-administered by patient taken the night of the study : TRAZODONE, BENZONATATE  SLEEP ARCHITECTURE The study was initiated at 9:20:41 PM and ended at 3:31:38 AM.  Sleep onset time was 30.3 minutes and the sleep efficiency was 75.5%. The total sleep time was 280.0 minutes.  Stage REM latency was 174.0 minutes.  The patient spent 2.32% of the night in stage N1 sleep, 67.68% in stage N2 sleep, 8.75% in stage N3 and 21.25% in REM.  Alpha intrusion was absent.  Supine sleep was 58.25%.  RESPIRATORY PARAMETERS The overall apnea/hypopnea index (AHI) was 7.1 per hour. There were 12 total apneas, including 9 obstructive, 3 central and 0 mixed apneas. There were 21 hypopneas and 64 RERAs.  The AHI during Stage REM sleep was 21.2 per hour.  AHI while supine was 11.0 per hour.  The mean oxygen saturation was 94.15%. The minimum SpO2 during sleep was 88.00%.  loud snoring was noted during this study.  CARDIAC DATA The 2 lead EKG demonstrated sinus rhythm. The mean heart rate was 94.10 beats per minute. Other EKG findings include: PVCs.  LEG  MOVEMENT DATA The total PLMS were 0 with a resulting PLMS index of 0.00. Associated arousal with leg movement index was 0.0 .  IMPRESSIONS - Mild obstructive sleep apnea occurred during this study (AHI = 7.1/h). - No significant central sleep apnea occurred during this study (CAI = 0.6/h). - The patient had minimal oxygen desaturation during the study (Min O2 = 88.00%) - The patient snored with loud snoring volume. - EKG findings include PVCs. - Clinically significant periodic limb movements did not occur during sleep. No significant associated arousals.  DIAGNOSIS - Obstructive Sleep Apnea (327.23 [G47.33 ICD-10])  RECOMMENDATIONS - Treatment for very mild OSA is directed at symptoms. Conservative management and observation may be sufficient. If necessary, CPAP or a fitted oral appliance can be options. - Positional therapy avoiding supine position during sleep. - Be careful with alcohol, sedatives and other CNS depressants that may worsen sleep apnea and disrupt normal sleep architecture. - Sleep hygiene should be reviewed to assess factors that may improve sleep quality. - Weight management and regular exercise should be initiated or continued if appropriate.  [Electronically signed] 11/07/2017 10:35 AM  Jetty Duhamel MD, ABSM Diplomate, American Board of Sleep Medicine   NPI: [id number]                          Jetty Duhamel Diplomate, American Board of Sleep Medicine  ELECTRONICALLY SIGNED ON:  11/13/2017, 3:43 PM [facility] PH: [phone]   FX: [phone] ACCREDITED BY THE AMERICAN ACADEMY OF SLEEP MEDICINE

## 2017-11-18 ENCOUNTER — Encounter (HOSPITAL_BASED_OUTPATIENT_CLINIC_OR_DEPARTMENT_OTHER): Payer: Self-pay

## 2018-02-09 ENCOUNTER — Encounter: Payer: Self-pay | Admitting: Family Medicine

## 2018-08-12 ENCOUNTER — Ambulatory Visit: Payer: BC Managed Care – PPO | Admitting: Pulmonary Disease

## 2018-08-12 ENCOUNTER — Encounter: Payer: Self-pay | Admitting: Pulmonary Disease

## 2018-08-12 VITALS — BP 126/78 | HR 105 | Ht 68.0 in | Wt 302.0 lb

## 2018-08-12 DIAGNOSIS — Z23 Encounter for immunization: Secondary | ICD-10-CM | POA: Diagnosis not present

## 2018-08-12 DIAGNOSIS — Z9989 Dependence on other enabling machines and devices: Secondary | ICD-10-CM | POA: Diagnosis not present

## 2018-08-12 DIAGNOSIS — G4733 Obstructive sleep apnea (adult) (pediatric): Secondary | ICD-10-CM

## 2018-08-12 NOTE — Progress Notes (Signed)
Pamela [name]    [id number]    09-Feb-1981  Primary Care Physician:Dewey, Christell Constant, MD  Referring Physician: Lewis Moccasin, MD [address] [place], [place] [address]  Chief complaint:   History of sleep apnea and insomnia  HPI: History of chronic insomnia for which she has had multiple medication trials Symptoms helped by trazodone 200 and gabapentin Recently was on high doses of prednisone for Crohn's disease-weaning off at present This was associated with over 50 pound weight gain An active smoker, trying to quit  Patient owns her own machine Usually tries to go to bed between 9 and 10 PM, takes her about 5 to 10 minutes to fall asleep, wakes up between 6 and 6:30 AM She feels her machine may not be as effective, especially with the significant weight gain recently  Smoking history: Set a quit date, definitely quitting  Outpatient Encounter Medications as of 08/12/2018  Medication Sig  . buPROPion (WELLBUTRIN XL) 300 MG 24 hr tablet Take by mouth daily.   . Cariprazine HCl (VRAYLAR) 3 MG CAPS Take 3 mg by mouth daily.  Marland Kitchen FLUoxetine (PROZAC) 40 MG capsule Take 40 mg by mouth daily.  . Levonorgestrel 19.5 MG IUD 1 Dose by Intrauterine route once.  . traZODone (DESYREL) 100 MG tablet Take 100 mg by mouth at bedtime.  Marland Kitchen VYVANSE 70 MG capsule Take 70 mg by mouth daily.   No facility-administered encounter medications on file as of 08/12/2018.     Allergies as of 08/12/2018 - Review Complete 08/12/2018  Allergen Reaction Noted  . Nsaids Other (See Comments) 07/12/2017    Past Medical History:  Diagnosis Date  . Anxiety   . Complication of anesthesia    wakes up crying/anxious/agitated and scratching self  . Cough 10/22/2017  . Crohn's disease (HCC)   . Depression   . GERD (gastroesophageal reflux disease)    TUMS as needed  . Macromastia 10/2017   bilateral  . Sleep apnea    no CPAP use in > 1 month; scheduled for repeat sleep study  10/22/2017  . Stuffy nose 10/22/2017    Past Surgical History:  Procedure Laterality Date  . BREAST REDUCTION SURGERY Bilateral 10/27/2017   Procedure: BILATERAL MAMMARY REDUCTION  (BREAST);  Surgeon: Contogiannis, Chales Abrahams, MD;  Location: [facility];  Service: Plastics;  Laterality: Bilateral;  . COLONOSCOPY     x 2  . LAPAROSCOPIC CHOLECYSTECTOMY  05/12/2005  . TONSILLECTOMY AND ADENOIDECTOMY      Family History  Problem Relation Age of Onset  . Crohn's disease Maternal Grandmother   . Cancer Paternal Grandfather        stomach    Social History   Socioeconomic History  . Marital status: Single    Spouse name: Not on file  . Number of children: 0  . Years of education: BS  . Highest education level: Not on file  Occupational History    Employer: [employer]    Comment: [employer]  Social Needs  . Financial resource strain: Not on file  . Food insecurity:    Worry: Not on file    Inability: Not on file  . Transportation needs:    Medical: Not on file    Non-medical: Not on file  Tobacco Use  . Smoking status: Current Some Day Smoker    Years: 15.00    Types: Cigarettes  . Smokeless tobacco: Never Used  . Tobacco comment: 2  cig./day  Substance and Sexual Activity  . Alcohol use: Yes    Comment: occasionally  . Drug use: No  . Sexual activity: Not on file  Lifestyle  . Physical activity:    Days per week: Not on file    Minutes per session: Not on file  . Stress: Not on file  Relationships  . Social connections:    Talks on phone: Not on file    Gets together: Not on file    Attends religious service: Not on file    Active member of club or organization: Not on file    Attends meetings of clubs or organizations: Not on file    Relationship status: Not on file  . Intimate partner violence:    Fear of current or ex partner: Not on file    Emotionally abused: Not on file    Physically abused: Not on file    Forced sexual activity: Not on file  Other  Topics Concern  . Not on file  Social History Narrative   Patient is right handed and resides with roommate, consumes no caffeine.    Review of Systems  Constitutional: Positive for unexpected weight change.  HENT: Negative.   Respiratory: Positive for apnea.   Gastrointestinal: Negative.   Genitourinary: Negative.   Musculoskeletal: Negative.   Skin: Negative.   Psychiatric/Behavioral: Positive for sleep disturbance.    Vitals:   08/12/18 1144  BP: 126/78  Pulse: (!) 105  SpO2: 99%     Physical Exam  Constitutional: She is oriented to person, place, and time. She appears well-nourished.  HENT:  Head: Normocephalic and atraumatic.  Eyes: Pupils are equal, round, and reactive to light. Conjunctivae and EOM are normal. Right eye exhibits no discharge. Left eye exhibits no discharge.  Neck: Normal range of motion. Neck supple. No tracheal deviation present. No thyromegaly present.  Cardiovascular: Normal rate and regular rhythm.  Pulmonary/Chest: Breath sounds normal. No respiratory distress.  Abdominal: Bowel sounds are normal. She exhibits no distension.  Musculoskeletal: Normal range of motion.  Neurological: She is alert and oriented to person, place, and time. No cranial nerve deficit.  Skin: Skin is warm and dry. No erythema.  Psychiatric: She has a normal mood and affect.   Data Reviewed: Previous sleep study reviewed  Assessment:  Obstructive sleep apnea on CPAP therapy  History of Crohn's disease  Significant weight gain recently, secondary to steroids  Plan/Recommendations:  DME referral  Obtain download for machine  Attempt to reduce dose of trazodone  Some of her medications may be contributing to her resting tachycardia--Vyvanse, Vraylar Orthostatic hypotension may be contributed to by vraylar  Even though the above medications may be contributing to symptoms, a cardiology evaluation for her tachycardia is not unreasonable, especially if theses  medications are needed for stability  Flu vaccine today  I will see her back in the office in about 3 months   Virl Diamond MD [facility] 08/12/2018, 12:17 PM  CC: Lewis Moccasin, MD

## 2018-08-12 NOTE — Patient Instructions (Signed)
Obstructive sleep apnea  We will refer you to a DME for CPAP supplies  We will obtain a download from the machine  Continue your weight loss efforts  Consider cardiology evaluation for resting tachycardia  I will see you back in the office in 3 months  We will be in touch with you from the office once we are able to get a download from the machine and changes need made

## 2019-05-18 ENCOUNTER — Other Ambulatory Visit (HOSPITAL_BASED_OUTPATIENT_CLINIC_OR_DEPARTMENT_OTHER): Payer: Self-pay

## 2019-05-18 DIAGNOSIS — G4710 Hypersomnia, unspecified: Secondary | ICD-10-CM

## 2019-05-18 DIAGNOSIS — G4730 Sleep apnea, unspecified: Secondary | ICD-10-CM

## 2019-07-13 ENCOUNTER — Ambulatory Visit (HOSPITAL_BASED_OUTPATIENT_CLINIC_OR_DEPARTMENT_OTHER): Payer: BC Managed Care – PPO | Attending: Psychiatry | Admitting: Internal Medicine

## 2019-07-13 ENCOUNTER — Other Ambulatory Visit: Payer: Self-pay

## 2019-07-13 DIAGNOSIS — G4733 Obstructive sleep apnea (adult) (pediatric): Secondary | ICD-10-CM | POA: Insufficient documentation

## 2019-07-13 DIAGNOSIS — G4710 Hypersomnia, unspecified: Secondary | ICD-10-CM

## 2019-07-13 DIAGNOSIS — G4730 Sleep apnea, unspecified: Secondary | ICD-10-CM

## 2019-07-23 DIAGNOSIS — G4733 Obstructive sleep apnea (adult) (pediatric): Secondary | ICD-10-CM

## 2019-07-23 NOTE — Procedures (Signed)
   Patient Name: Pamela [name], Pamela [name] Date: 07/13/2019 Gender: Female D.O.B: June 11, 1981 Age (years): 37 Referring Provider: Lulu Riding Height (inches): 59 Interpreting Physician: Baird Lyons MD, ABSM Weight (lbs): 271 RPSGT: Jacolyn Reedy BMI: 41 MRN: [id number] Neck Size: 16.00  CLINICAL INFORMATION Sleep Study Type: HST Indication for sleep study: none reported Epworth Sleepiness Score: 7  Most recent polysomnogram dated 10/23/2017 revealed an AHI of 7.1/h and RDI of 20.8/h.  SLEEP STUDY TECHNIQUE A multi-channel overnight portable sleep study was performed. The channels recorded were: nasal airflow, thoracic respiratory movement, and oxygen saturation with a pulse oximetry. Snoring was also monitored.  MEDICATIONS Patient self administered medications include: BENZONATATE, TRAZODONE.  SLEEP ARCHITECTURE Patient was studied for 450.1 minutes. The sleep efficiency was 99.9 % and the patient was supine for 71.7%. The arousal index was 0.0 per hour.  RESPIRATORY PARAMETERS The overall AHI was 5.9 per hour, with a central apnea index of 0.0 per hour. The oxygen nadir was 87% during sleep.  CARDIAC DATA Mean heart rate during sleep was 86.0 bpm.  IMPRESSIONS - Mild obstructive sleep apnea occurred during this study (AHI = 5.9/h). - No significant central sleep apnea occurred during this study (CAI = 0.0/h). - Mild oxygen desaturation was noted during this study (Min O2 = 87%). - Patient snored.  DIAGNOSIS - Obstructive Sleep Apnea (327.23 [G47.33 ICD-10])  RECOMMENDATIONS - Treatment for mild obstructive sleep apnea is directed a symptoms. Conservative management may include observation, weight loss and sleep position off back. - Other options, including CPAP or a fitted oral appliance, and possible re-testing at a later date, would be based on clinicial judgment. - Be careful with alcohol, sedatives and other CNS depressants that may worsen sleep apnea  and disrupt normal sleep architecture. - Sleep hygiene should be reviewed to assess factors that may improve sleep quality. - Weight management and regular exercise should be initiated or continued.  [Electronically signed] 07/23/2019 11:02 AM  Baird Lyons MD, ABSM Diplomate, American Board of Sleep Medicine   NPI: [id number]                          [place] [place] [facility]  ELECTRONICALLY SIGNED ON:  07/23/2019, 10:45 AM [place] PH: [phone]   FX: [phone] [place]
